# Patient Record
Sex: Female | Born: 1970
Health system: Southern US, Community
[De-identification: ages and names within clinical notes are randomized; demographics above are authoritative.]

## PROBLEM LIST (undated history)

## (undated) DIAGNOSIS — E7800 Pure hypercholesterolemia, unspecified: Secondary | ICD-10-CM

## (undated) DIAGNOSIS — K219 Gastro-esophageal reflux disease without esophagitis: Secondary | ICD-10-CM

## (undated) HISTORY — PX: OOPHORECTOMY: SHX6387

## (undated) HISTORY — PX: CHOLECYSTECTOMY: SHX55

---

## 1998-06-08 ENCOUNTER — Emergency Department (HOSPITAL_COMMUNITY): Admission: EM | Admit: 1998-06-08 | Discharge: 1998-06-08 | Payer: Self-pay | Admitting: Emergency Medicine

## 1998-06-08 ENCOUNTER — Encounter: Payer: Self-pay | Admitting: Emergency Medicine

## 2004-02-26 ENCOUNTER — Other Ambulatory Visit: Admission: RE | Admit: 2004-02-26 | Discharge: 2004-02-26 | Payer: Self-pay | Admitting: Family Medicine

## 2005-01-23 ENCOUNTER — Emergency Department (HOSPITAL_COMMUNITY): Admission: EM | Admit: 2005-01-23 | Discharge: 2005-01-23 | Payer: Self-pay | Admitting: Emergency Medicine

## 2005-02-16 ENCOUNTER — Ambulatory Visit (HOSPITAL_COMMUNITY): Admission: RE | Admit: 2005-02-16 | Discharge: 2005-02-16 | Payer: Self-pay | Admitting: *Deleted

## 2005-02-16 ENCOUNTER — Encounter (INDEPENDENT_AMBULATORY_CARE_PROVIDER_SITE_OTHER): Payer: Self-pay | Admitting: *Deleted

## 2005-02-23 ENCOUNTER — Inpatient Hospital Stay (HOSPITAL_COMMUNITY): Admission: EM | Admit: 2005-02-23 | Discharge: 2005-02-27 | Payer: Self-pay | Admitting: *Deleted

## 2005-03-03 ENCOUNTER — Ambulatory Visit (HOSPITAL_COMMUNITY): Admission: RE | Admit: 2005-03-03 | Discharge: 2005-03-03 | Payer: Self-pay | Admitting: Gastroenterology

## 2005-03-10 ENCOUNTER — Encounter: Admission: RE | Admit: 2005-03-10 | Discharge: 2005-03-10 | Payer: Self-pay | Admitting: Gastroenterology

## 2005-04-14 ENCOUNTER — Other Ambulatory Visit: Admission: RE | Admit: 2005-04-14 | Discharge: 2005-04-14 | Payer: Self-pay | Admitting: Family Medicine

## 2005-04-24 ENCOUNTER — Ambulatory Visit (HOSPITAL_COMMUNITY): Admission: RE | Admit: 2005-04-24 | Discharge: 2005-04-24 | Payer: Self-pay | Admitting: Gastroenterology

## 2009-01-22 ENCOUNTER — Other Ambulatory Visit: Admission: RE | Admit: 2009-01-22 | Discharge: 2009-01-22 | Payer: Self-pay | Admitting: Obstetrics and Gynecology

## 2009-02-10 ENCOUNTER — Encounter: Admission: RE | Admit: 2009-02-10 | Discharge: 2009-02-10 | Payer: Self-pay | Admitting: Obstetrics and Gynecology

## 2010-08-29 NOTE — H&P (Signed)
NAMEMARGEAN, KORELL                ACCOUNT NO.:  [id number]   MEDICAL RECORD NO.:  [id number]          PATIENT TYPE:  EMS   LOCATION:  ED                           FACILITY:  [facility]   PHYSICIAN:  Vikki Ports, MDDATE OF BIRTH:  March 10, 1971   DATE OF ADMISSION:  02/23/2005  DATE OF DISCHARGE:                                HISTORY & PHYSICAL   HISTORY OF PRESENT ILLNESS:  The patient is a 40 year old white female now  one week status post lap chole for cholelithiasis and one episode of  cholecystitis now with a 3 day history of nausea, vague abdominal discomfort  and dark urine. She was seen by her primary care doctor and sent here for  further evaluation and treatment. Workup here shows elevated liver function  tests, lipase and a normal white count.   PAST MEDICAL HISTORY:  None.   PAST SURGICAL HISTORY:  As above.   MEDICATIONS:  None.   PHYSICAL EXAMINATION:  VITAL SIGNS:  She is afebrile, heart rate is in the  80s, blood pressure is 126/70.  HEENT:  Benign, normocephalic, atraumatic. Pupils equal round and reactive  to light. Conjunctivae are without injection. Sclera are icteric.  LUNGS:  Clear to auscultation and percussion x2.  HEART:  Regular rate and rhythm without murmurs, rubs or gallops.  ABDOMEN:  Morbidly obese, soft, and tender in the epigastrium with normal  bowel sounds. There are no abdominal wall hernia defects.  EXTREMITIES:  Show no cyanosis, clubbing or edema.   LABORATORY DATA:  Bilirubin of 4.5, AST of 100, ALT of 206, lipase of 365  and a white count of 4000.   IMPRESSION:  Retained common duct stone with probable pancreatitis secondary  to that.   PLAN:  HIDA scan, CT scan of the abdomen, GI consultation. I have already  discussed this with Dr. Jeani Hawking and IV antibiotics.      Vikki Ports, MD  Electronically Signed     KRH/MEDQ  D:  02/23/2005  T:  02/23/2005  Job:  [id number]

## 2010-08-29 NOTE — Op Note (Signed)
NAMECASSADEE, VANZANDT                ACCOUNT NO.:  [id number]   MEDICAL RECORD NO.:  [id number]          PATIENT TYPE:  AMB   LOCATION:  DAY                          FACILITY:  [facility]   PHYSICIAN:  Vikki Ports, MDDATE OF BIRTH:  [birth year]   DATE OF PROCEDURE:  02/16/2005  DATE OF DISCHARGE:                                 OPERATIVE REPORT   PREOPERATIVE DIAGNOSIS:  Symptomatic cholelithiasis.   POSTOPERATIVE DIAGNOSIS:  Symptomatic cholelithiasis.   PROCEDURE:  Laparoscopic cholecystectomy.   SURGEON:  Dr. Danna Hefty   ASSISTANT:  Dr. Claud Kelp   ANESTHESIA:  General.   DESCRIPTION:  The patient was taken to the operating room, placed in a  supine position.  After adequate general anesthesia was induced using  endotracheal tube, the abdomen was prepped and draped in normal sterile  fashion.  Using transverse infraumbilical incision, I dissected down to the  fascia.  Fascia was opened vertically.  Of note, the patient was quite deep  down to the fascia secondary to morbid obesity.  An 0 Vicryl pursestring  suture was placed around the fascial defect and Hasson trocar was placed in  the abdomen.  Pneumoperitoneum was obtained.  Under direct visualization, an  11 mm trocar was placed in the subxiphoid region, two 5 mm ports were placed  in the right abdomen.  The gallbladder was identified and retracted  cephalad.  It was quite firm and therefore was aspirated.  Dissection at the  neck of the gallbladder in visualizing the triangle of Calot and cystic duct  at its junction with the gallbladder and common duct were identified.  It  was triply clipped and divided.  Cystic artery was dissected in a similar  fashion, triply clipped, and divided.  Gallbladder was taken off the  gallbladder bed using Bovie electrocautery.  Of note, the gallbladder was  entered with some spillage of stones.  It was placed in EndoCatch bag and  removed through the umbilical port.   Right upper quadrant was copiously  irrigated; stones were removed.  Adequate hemostasis was ensured.  Incisions  were injected using Marcaine.  Pneumoperitoneum was released.  Infraumbilical fascial defect was closed with the 0 Vicryl pursestring  suture.  Skin incisions were closed with subcuticular 4-0 Monocryl.  Steri-  Strips and sterile dressings were applied.  The patient the tolerated  procedure well and went to PACU in good condition.      Vikki Ports, MD  Electronically Signed     KRH/MEDQ  D:  02/16/2005  T:  02/16/2005  Job:  [id number]

## 2010-08-29 NOTE — Consult Note (Signed)
Nicole Walters, Nicole Walters                ACCOUNT NO.:  [id number]   MEDICAL RECORD NO.:  [id number]          PATIENT TYPE:  INP   LOCATION:  1606                         FACILITY:  [facility]   PHYSICIAN:  Jordan Hawks. Elnoria Howard, MD    DATE OF BIRTH:  1970-06-16   DATE OF CONSULTATION:  02/23/2005  DATE OF DISCHARGE:                                   CONSULTATION   REASON FOR CONSULTATION:  Obstructive jaundice.   REFERRING PHYSICIAN:  Vikki Ports, MD   HISTORY OF PRESENT ILLNESS:  This is a 40 year old black female with a past  medical history of cholelithiasis, status post laparoscopic cholecystectomy  approximately one week ago by Dr. Luan Pulling.  This was performed without  any complications and prior to the surgical procedure, there are no  abnormalities noted at that time.  She subsequently underwent the procedure  and did well postoperatively until this past Friday.  Patient states that  she had nausea, vomiting, and abdominal pain.  The patient also reported  having chills with it but no reports of any fever.  The symptoms continued  to pertinent throughout the weekend, and she subsequently went to her  primary care physician and felt that she needed to be evaluated in the  emergency room.  Subsequent evaluation in the emergency room revealed that  she had elevation in her transaminases, consistent with an obstructive  etiology and an elevation in the lipase.  The white blood cell count was  noted to be normal.   PAST MEDICAL HISTORY:  As stated above.   PAST SURGICAL HISTORY:  As stated above.   MEDICATIONS:  None.   REVIEW OF SYSTEMS:  Significant for nausea and vomiting.  Headache.  No  blurry vision.  No dysuria, dysarthria, muscle pain, arthritis, chest pain,  shortness of breath, fever.  It is positive for chills.   ALLERGIES:  None.   PHYSICAL EXAMINATION:  VITAL SIGNS:  Blood pressure 142/92, heart rate 84,  temperature 98.5.  GENERAL:  The patient is in no acute  distress; however, she does appear to  be fatigued and jaundiced.  HEENT:  Normocephalic and atraumatic.  Extraocular muscles are intact.  Pupils are equal, round and reactive to light.  NECK:  Neck is supple with no lymphadenopathy.  LUNGS:  Clear to auscultation bilaterally.  CARDIOVASCULAR:  Regular rate and rhythm without murmurs, rubs or gallops.  ABDOMEN:  Obese, soft.  Tender in the epigastric region.  There is also some  mild tenderness at the laparoscopic incision sites, which appear to be  healing well.  Positive bowel sounds.  EXTREMITIES:  No clubbing, cyanosis or edema.   LABORATORY VALUES:  White blood cell count 4.8, hemoglobin 13.3, platelets  251.  Sodium 141, potassium 3.9, chloride 102, CO2 30, BUN 8, creatinine  0.7.  Glucose 99.  AST 100, ALT 206.  Alk phos 370.  Total bilirubin 4.6.  Lipase 367.  PT 12, INR 0.9, PTT 26.   IMPRESSION:  1.  Gallstone pancreatitis.  2.  Status post laparoscopic cholecystectomy.   I have reviewed the patient's chart and  the recent HIDA scan.  The HIDA scan  does not reveal any __________ of the tracer into the small intestine, which  is suggestive of an obstructive etiology.  The CT scan of the abdomen is  pending at this time.  From the evaluation of the HIDA scan as well as the  patient's history and the laboratory findings, consistent with a gallstone  pancreatitis.  There is no evidence of any ascending cholangitis at this  time.  No intraoperative cholangiogram was performed at the time of the  laparoscopic cholecystectomy.   PLAN:  1.  Add ciprofloxacin 500 mg IV q.12h.  2.  Plan for ERCP tomorrow.  3.  Follow up on the CT scan.  4.  N.P.O. after midnight.  5.  If the patient's clinical status should acutely change, an emergent ERCP      will be performed.      Jordan Hawks Elnoria Howard, MD  Electronically Signed     PDH/MEDQ  D:  02/23/2005  T:  02/23/2005  Job:  [id number]   cc:   Vikki Ports, MD  [address], [address]  [place]  [place] [address]

## 2010-08-29 NOTE — Discharge Summary (Signed)
NAMEUBAH, RADKE                ACCOUNT NO.:  [id number]   MEDICAL RECORD NO.:  [id number]          PATIENT TYPE:  INP   LOCATION:  1606                         FACILITY:  [facility]   PHYSICIAN:  Vikki Ports, MDDATE OF BIRTH:  [birth year]   DATE OF ADMISSION:  02/23/2005  DATE OF DISCHARGE:  02/27/2005                                 DISCHARGE SUMMARY   ADMISSION DIAGNOSIS:  Status post laparoscopic cholecystectomy with  choledocholithiasis.   DISCHARGE DIAGNOSIS:  Status post laparoscopic cholecystectomy with  choledocholithiasis.   PROCEDURE:  ERCP x2.   CONDITION ON DISCHARGE:  Good and improved.   DISPOSITIONS:  Follow up with Dr. Elnoria Howard in the office.   MEDICATIONS AT DISCHARGE:  Cipro, Vistaril and cholestyramine.   BRIEF HISTORY:  The patient is a 40 year old female 1 week status post lap  chole who presented to her primary care doctor with evidence of jaundice.  She was admitted with elevation of her liver function tests and lipase. She  was seen in consultation by Dr. Jeani Hawking and admitted for IV fluids,  antibiotics and probable ERCP. The patient underwent ERCP and extraction of  a common duct stone and sphincterotomy by Dr. Elnoria Howard.  While in the hospital,  however, her liver function tests did not decrease as expected and she was  watched by Dr. Elnoria Howard. Her pain, however, did improve. While in the hospital,  her LFTs actually somewhat increased and she was taken back for repeat ERCP  on hospital day #3. Dr. Elnoria Howard saw no evidence of ascending cholangitis and  opted to discharge her on p.o. ciprofloxacin and cholestyramine. Follow up  with the patient in the office 3 days after discharge. Diagnosis at that  time was papillary edema.      Vikki Ports, MD  Electronically Signed     KRH/MEDQ  D:  03/16/2005  T:  03/17/2005  Job:  [id number]   cc:   Jordan Hawks. Elnoria Howard, MD  Fax: [phone]

## 2011-01-11 ENCOUNTER — Emergency Department (HOSPITAL_COMMUNITY)
Admission: EM | Admit: 2011-01-11 | Discharge: 2011-01-11 | Disposition: A | Discharge status: Home / Self Care | Payer: Commercial Managed Care - PPO | Attending: Emergency Medicine | Admitting: Emergency Medicine

## 2011-01-11 ENCOUNTER — Emergency Department (HOSPITAL_COMMUNITY): Payer: Commercial Managed Care - PPO

## 2011-01-11 DIAGNOSIS — S335XXA Sprain of ligaments of lumbar spine, initial encounter: Secondary | ICD-10-CM | POA: Insufficient documentation

## 2011-01-11 DIAGNOSIS — Z79899 Other long term (current) drug therapy: Secondary | ICD-10-CM | POA: Insufficient documentation

## 2011-01-11 DIAGNOSIS — R109 Unspecified abdominal pain: Secondary | ICD-10-CM | POA: Insufficient documentation

## 2011-01-11 DIAGNOSIS — M545 Low back pain, unspecified: Secondary | ICD-10-CM | POA: Insufficient documentation

## 2011-01-11 DIAGNOSIS — F3289 Other specified depressive episodes: Secondary | ICD-10-CM | POA: Insufficient documentation

## 2011-01-11 DIAGNOSIS — F329 Major depressive disorder, single episode, unspecified: Secondary | ICD-10-CM | POA: Insufficient documentation

## 2011-01-11 DIAGNOSIS — X58XXXA Exposure to other specified factors, initial encounter: Secondary | ICD-10-CM | POA: Insufficient documentation

## 2011-01-11 LAB — URINALYSIS, ROUTINE W REFLEX MICROSCOPIC
Bilirubin Urine: NEGATIVE
Glucose, UA: NEGATIVE mg/dL
Hgb urine dipstick: NEGATIVE
Ketones, ur: NEGATIVE mg/dL
Leukocytes, UA: NEGATIVE
Nitrite: NEGATIVE
Protein, ur: NEGATIVE mg/dL
Specific Gravity, Urine: 1.014 (ref 1.005–1.030)
Urobilinogen, UA: 0.2 mg/dL (ref 0.0–1.0)
pH: 6 (ref 5.0–8.0)

## 2011-01-11 LAB — CBC
HCT: 37.2 % (ref 36.0–46.0)
Hemoglobin: 12.5 g/dL (ref 12.0–15.0)
MCH: 30.7 pg (ref 26.0–34.0)
MCHC: 33.6 g/dL (ref 30.0–36.0)
MCV: 91.4 fL (ref 78.0–100.0)
Platelets: 276 K/uL (ref 150–400)
RBC: 4.07 MIL/uL (ref 3.87–5.11)
RDW: 13.7 % (ref 11.5–15.5)
WBC: 6.7 K/uL (ref 4.0–10.5)

## 2011-01-11 LAB — POCT I-STAT, CHEM 8
Calcium, Ion: 1.14 mmol/L (ref 1.12–1.32)
Chloride: 103 mEq/L (ref 96–112)
Creatinine, Ser: 0.9 mg/dL (ref 0.50–1.10)
Glucose, Bld: 99 mg/dL (ref 70–99)
HCT: 39 % (ref 36.0–46.0)
Hemoglobin: 13.3 g/dL (ref 12.0–15.0)
Potassium: 3.8 mEq/L (ref 3.5–5.1)
TCO2: 26 mmol/L (ref 0–100)

## 2011-01-11 LAB — DIFFERENTIAL
Basophils Absolute: 0.1 10*3/uL (ref 0.0–0.1)
Eosinophils Absolute: 0.1 10*3/uL (ref 0.0–0.7)
Eosinophils Relative: 2 % (ref 0–5)
Lymphocytes Relative: 37 % (ref 12–46)
Lymphs Abs: 2.5 10*3/uL (ref 0.7–4.0)
Monocytes Absolute: 0.5 10*3/uL (ref 0.1–1.0)
Monocytes Relative: 7 % (ref 3–12)
Neutro Abs: 3.6 10*3/uL (ref 1.7–7.7)

## 2011-01-11 LAB — POCT PREGNANCY, URINE: Preg Test, Ur: NEGATIVE

## 2011-10-27 ENCOUNTER — Other Ambulatory Visit (HOSPITAL_COMMUNITY)
Admission: RE | Admit: 2011-10-27 | Discharge: 2011-10-27 | Disposition: A | Discharge status: Home / Self Care | Payer: Commercial Managed Care - PPO | Source: Ambulatory Visit | Attending: Family Medicine | Admitting: Family Medicine

## 2011-10-27 ENCOUNTER — Other Ambulatory Visit: Payer: Self-pay | Admitting: Family Medicine

## 2011-10-27 DIAGNOSIS — Z0000 Encounter for general adult medical examination without abnormal findings: Secondary | ICD-10-CM | POA: Insufficient documentation

## 2011-12-18 ENCOUNTER — Other Ambulatory Visit: Payer: Self-pay | Admitting: Obstetrics and Gynecology

## 2011-12-21 ENCOUNTER — Other Ambulatory Visit: Payer: Self-pay | Admitting: Obstetrics and Gynecology

## 2011-12-21 DIAGNOSIS — N938 Other specified abnormal uterine and vaginal bleeding: Secondary | ICD-10-CM

## 2011-12-25 ENCOUNTER — Ambulatory Visit (HOSPITAL_COMMUNITY)
Admission: RE | Admit: 2011-12-25 | Discharge: 2011-12-25 | Disposition: A | Discharge status: Home / Self Care | Payer: Commercial Managed Care - PPO | Source: Ambulatory Visit | Attending: Obstetrics and Gynecology | Admitting: Obstetrics and Gynecology

## 2011-12-25 DIAGNOSIS — N949 Unspecified condition associated with female genital organs and menstrual cycle: Secondary | ICD-10-CM | POA: Insufficient documentation

## 2011-12-25 DIAGNOSIS — N938 Other specified abnormal uterine and vaginal bleeding: Secondary | ICD-10-CM

## 2013-03-17 ENCOUNTER — Emergency Department (HOSPITAL_COMMUNITY): Payer: Commercial Managed Care - PPO

## 2013-03-17 ENCOUNTER — Emergency Department (HOSPITAL_COMMUNITY)
Admission: EM | Admit: 2013-03-17 | Discharge: 2013-03-17 | Disposition: A | Discharge status: Home / Self Care | Payer: Commercial Managed Care - PPO | Attending: Emergency Medicine | Admitting: Emergency Medicine

## 2013-03-17 ENCOUNTER — Encounter (HOSPITAL_COMMUNITY): Payer: Self-pay | Admitting: Emergency Medicine

## 2013-03-17 DIAGNOSIS — S46909A Unspecified injury of unspecified muscle, fascia and tendon at shoulder and upper arm level, unspecified arm, initial encounter: Secondary | ICD-10-CM | POA: Insufficient documentation

## 2013-03-17 DIAGNOSIS — S298XXA Other specified injuries of thorax, initial encounter: Secondary | ICD-10-CM | POA: Insufficient documentation

## 2013-03-17 DIAGNOSIS — S4980XA Other specified injuries of shoulder and upper arm, unspecified arm, initial encounter: Secondary | ICD-10-CM | POA: Insufficient documentation

## 2013-03-17 DIAGNOSIS — W1809XA Striking against other object with subsequent fall, initial encounter: Secondary | ICD-10-CM

## 2013-03-17 DIAGNOSIS — R0789 Other chest pain: Secondary | ICD-10-CM

## 2013-03-17 DIAGNOSIS — M25511 Pain in right shoulder: Secondary | ICD-10-CM

## 2013-03-17 DIAGNOSIS — Y939 Activity, unspecified: Secondary | ICD-10-CM | POA: Insufficient documentation

## 2013-03-17 DIAGNOSIS — IMO0001 Reserved for inherently not codable concepts without codable children: Secondary | ICD-10-CM | POA: Insufficient documentation

## 2013-03-17 DIAGNOSIS — W010XXA Fall on same level from slipping, tripping and stumbling without subsequent striking against object, initial encounter: Secondary | ICD-10-CM | POA: Insufficient documentation

## 2013-03-17 DIAGNOSIS — Y929 Unspecified place or not applicable: Secondary | ICD-10-CM | POA: Insufficient documentation

## 2013-03-17 DIAGNOSIS — Z79899 Other long term (current) drug therapy: Secondary | ICD-10-CM | POA: Insufficient documentation

## 2013-03-17 MED ORDER — IBUPROFEN 800 MG PO TABS: 800.0000 mg | ORAL_TABLET | Freq: Three times a day (TID) | ORAL | Status: DC

## 2013-03-17 MED ORDER — HYDROCODONE-ACETAMINOPHEN 5-325 MG PO TABS: 1.0000 | ORAL_TABLET | Freq: Four times a day (QID) | ORAL | Status: DC | PRN

## 2013-03-17 MED ORDER — HYDROCODONE-ACETAMINOPHEN 5-325 MG PO TABS
1.0000 | ORAL_TABLET | Freq: Once | ORAL | Status: AC
  Administered 2013-03-17: 1 via ORAL
  Filled 2013-03-17: qty 1

## 2013-03-17 NOTE — ED Notes (Signed)
Pt states she struck her right shoulder and right lateral rib area on the vanity in the bathroom.

## 2013-03-17 NOTE — ED Provider Notes (Signed)
CSN: [id number]     Arrival date & time 03/17/13  1446 History  This chart was scribed for non-physician practitioner, Jeannetta Ellis, PA-C, working with Layla Maw Ward, DO by Shari Heritage, ED Scribe. This patient was seen in room TR06C/TR06C and the patient's care was started at 3:45 PM.    Chief Complaint  Patient presents with  . Fall    The history is provided by the patient. No language interpreter was used.    HPI Comments: Nicole Walters is a 42 y.o. female who presents to the Emergency Department complaining of a fall that occurred about 2 hours ago. Patient states that she was getting out of the shower when she slipped and struck her right shoulder and right lateral rib against the toilet and vanity, but did not hit her head or LOC. She is complaining of constant right shoulder and upper arm pain as well as right rib pain. She rates pain as 10/10 w/o any alleviating or aggravating factors. She states that she was not dizzy or lightheaded prior to the fall. She also denies any CP, SOB, HA prior to fall. She denies visual disturbance, vomiting, chest pain, headache or loss of consciousness. She states that she had someone drive her to the ED. She has a surgical history of cholecystectomy.   History reviewed. No pertinent past medical history. Past Surgical History  Procedure Laterality Date  . Cholecystectomy     History reviewed. No pertinent family history. History  Substance Use Topics  . Smoking status: Never Smoker   . Smokeless tobacco: Not on file  . Alcohol Use: No   OB History   Grav Para Term Preterm Abortions TAB SAB Ect Mult Living                 Review of Systems  Constitutional: Negative for fever and chills.  HENT: Negative.   Eyes: Negative for visual disturbance.  Respiratory: Negative for shortness of breath.   Cardiovascular: Negative for chest pain.  Gastrointestinal: Negative for nausea, vomiting and abdominal pain.  Musculoskeletal: Positive  for arthralgias and myalgias.  Skin: Negative.   Neurological: Negative for syncope and headaches.    Allergies  Levaquin  Home Medications   Current Outpatient Rx  Name  Route  Sig  Dispense  Refill  . acetaminophen (TYLENOL) 500 MG tablet   Oral   Take 1,000 mg by mouth 2 (two) times daily as needed (pain).         Marland Kitchen buPROPion (WELLBUTRIN XL) 300 MG 24 hr tablet   Oral   Take 300 mg by mouth daily.          . Calcium Carb-Cholecalciferol (CALCIUM 600 + D PO)   Oral   Take 1 tablet by mouth daily.         . cyclobenzaprine (FLEXERIL) 10 MG tablet   Oral   Take 10 mg by mouth daily as needed for muscle spasms.          Marland Kitchen esomeprazole (NEXIUM) 40 MG capsule   Oral   Take 40 mg by mouth daily at 12 noon.         Marland Kitchen FLUoxetine (PROZAC) 40 MG capsule   Oral   Take 40 mg by mouth daily.          . Glucosamine-Chondroit-Vit C-Mn (GLUCOSAMINE-CHONDROITIN) CAPS   Oral   Take 2 capsules by mouth daily. Glucosamine 1500 mg/ chondroiton 1200 mg         . levonorgestrel (  MIRENA) 20 MCG/24HR IUD   Intrauterine   1 each by Intrauterine route once. Implanted November or December 2013         . loratadine (CLARITIN) 10 MG tablet   Oral   Take 10 mg by mouth daily.         Marland Kitchen lovastatin (MEVACOR) 20 MG tablet   Oral   Take 20 mg by mouth daily.          . Multiple Vitamin (MULTIVITAMIN WITH MINERALS) TABS tablet   Oral   Take 1 tablet by mouth daily.         . rizatriptan (MAXALT) 10 MG tablet   Oral   Take 10 mg by mouth as needed for migraine. May take 2nd tablet if needed - no more than 2 tablets in 24 hours         . zolpidem (AMBIEN) 10 MG tablet   Oral   Take 10 mg by mouth at bedtime as needed for sleep.          Marland Kitchen HYDROcodone-acetaminophen (NORCO/VICODIN) 5-325 MG per tablet   Oral   Take 1 tablet by mouth every 6 (six) hours as needed for severe pain.   8 tablet   0   . ibuprofen (ADVIL,MOTRIN) 800 MG tablet   Oral   Take 1  tablet (800 mg total) by mouth 3 (three) times daily.   21 tablet   0    Triage Vitals: BP 157/95  Pulse 76  Temp(Src) 98.2 F (36.8 C) (Oral)  Resp 22  SpO2 96% Physical Exam  Constitutional: She is oriented to person, place, and time. She appears well-developed and well-nourished. No distress.  HENT:  Head: Normocephalic and atraumatic.  Right Ear: External ear normal.  Left Ear: External ear normal.  Nose: Nose normal.  Mouth/Throat: Oropharynx is clear and moist. No oropharyngeal exudate.  Eyes: Conjunctivae and EOM are normal. Pupils are equal, round, and reactive to light.  Neck: Normal range of motion. Neck supple.  Cardiovascular: Normal rate, regular rhythm, normal heart sounds and intact distal pulses.   Pulmonary/Chest: Effort normal and breath sounds normal. No respiratory distress.  Abdominal: Soft. There is no tenderness.  Musculoskeletal: Normal range of motion.       Right shoulder: She exhibits tenderness. She exhibits normal range of motion, no bony tenderness and no effusion.  Negative Adson's maneuver. Negative empty can test. ROM intact with Appley Scratch maneuver.   Neurological: She is alert and oriented to person, place, and time. She has normal strength. No cranial nerve deficit or sensory deficit. Gait normal. GCS eye subscore is 4. GCS verbal subscore is 5. GCS motor subscore is 6.  No pronator drift. Bilateral heel-knee-shin intact.  Skin: Skin is warm and dry. Abrasion noted. She is not diaphoretic.       ED Course  Procedures (including critical care time) DIAGNOSTIC STUDIES: Oxygen Saturation is 96% on room air, adequate by my interpretation.    COORDINATION OF CARE: 3:49 PM- Patient presents with shoulder pain and rib pain after a fall. X-rays are negative for fracture or other bony injury. Will prescribe a course of pain medicines. Have advised patient to apply ice, rest and take anti-inflammatory medications. Will provide information for  orthopedics if problem worsens. Patient informed of current plan for treatment and evaluation and agrees with plan at this time.    Imaging Review Dg Chest 2 View  03/17/2013   CLINICAL DATA:  Fall, pain  EXAM: CHEST  2 VIEW  COMPARISON:  01/11/2011  FINDINGS: Cardiomediastinal silhouette is stable. Mild elevation of the right hemidiaphragm again noted. No acute infiltrate or pulmonary edema. No diagnostic pneumothorax.  IMPRESSION: No active cardiopulmonary disease.   Electronically Signed   By: Natasha Mead M.D.   On: 03/17/2013 15:35   Dg Shoulder Right  03/17/2013   CLINICAL DATA:  Fall, right shoulder pain  EXAM: RIGHT SHOULDER - 2+ VIEW  COMPARISON:  None.  FINDINGS: There is no evidence of fracture or dislocation. There is no evidence of arthropathy or other focal bone abnormality. Soft tissues are unremarkable.  IMPRESSION: Negative.   Electronically Signed   By: Natasha Mead M.D.   On: 03/17/2013 15:33    EKG Interpretation   None       MDM   1. Right shoulder pain   2. Fall against object, initial encounter   3. Right-sided chest wall pain     Afebrile, NAD, non-toxic appearing, AAOx4. No neurofocal deficits. Neurovascularly intact. Normal sensation. Fall mechanical in nature. I have reviewed nursing notes, vital signs, and all appropriate lab and imaging results for this patient.   1) Shoulder pain: PE shows no instability, tenderness, or deformity of acromioclavicular and sternoclavicular joints, the cervical spine, glenohumeral joint, coracoid process, acromion, or scapula. Good shoulder strength during empty can test. Good ROM during scratch test. No signs of impingement on Adson's maneuver.   2) Right sided chest wall pain: abrasions noted. No bruising. TTP. No obvious deformity. Lungs CTA. CXR reviewed.    Return precautions discussed. Patient is agreeable to plan. Patient is stable at time of discharge    I personally performed the services described in this  documentation, which was scribed in my presence. The recorded information has been reviewed and is accurate.     Jeannetta Ellis, PA-C 03/17/13 2204

## 2013-03-17 NOTE — ED Notes (Addendum)
Per pt sts she was in the shower and fell on her right shoulder. sts hurts when she breathes. sts her upper back area and side hurt also. sts she slipped and did not pass out.

## 2013-03-17 NOTE — ED Provider Notes (Signed)
Medical screening examination/treatment/procedure(s) were performed by non-physician practitioner and as supervising physician I was immediately available for consultation/collaboration.  EKG Interpretation   None         Belinda Bringhurst N Melicia Esqueda, DO 03/17/13 2357 

## 2013-10-03 ENCOUNTER — Ambulatory Visit
Admission: RE | Admit: 2013-10-03 | Discharge: 2013-10-03 | Disposition: A | Discharge status: Home / Self Care | Payer: Commercial Managed Care - PPO | Source: Ambulatory Visit | Attending: Family Medicine | Admitting: Family Medicine

## 2013-10-03 ENCOUNTER — Other Ambulatory Visit: Payer: Self-pay | Admitting: Family Medicine

## 2013-10-03 DIAGNOSIS — M79672 Pain in left foot: Secondary | ICD-10-CM

## 2014-08-01 ENCOUNTER — Emergency Department (HOSPITAL_COMMUNITY): Payer: Worker's Compensation

## 2014-08-01 ENCOUNTER — Emergency Department (HOSPITAL_COMMUNITY)
Admission: EM | Admit: 2014-08-01 | Discharge: 2014-08-01 | Disposition: A | Discharge status: Home / Self Care | Payer: Worker's Compensation | Attending: Emergency Medicine | Admitting: Emergency Medicine

## 2014-08-01 ENCOUNTER — Encounter (HOSPITAL_COMMUNITY): Payer: Self-pay | Admitting: *Deleted

## 2014-08-01 DIAGNOSIS — Y990 Civilian activity done for income or pay: Secondary | ICD-10-CM | POA: Diagnosis not present

## 2014-08-01 DIAGNOSIS — Y9289 Other specified places as the place of occurrence of the external cause: Secondary | ICD-10-CM | POA: Insufficient documentation

## 2014-08-01 DIAGNOSIS — E780 Pure hypercholesterolemia: Secondary | ICD-10-CM | POA: Diagnosis not present

## 2014-08-01 DIAGNOSIS — Z791 Long term (current) use of non-steroidal anti-inflammatories (NSAID): Secondary | ICD-10-CM | POA: Diagnosis not present

## 2014-08-01 DIAGNOSIS — Y9389 Activity, other specified: Secondary | ICD-10-CM | POA: Insufficient documentation

## 2014-08-01 DIAGNOSIS — K219 Gastro-esophageal reflux disease without esophagitis: Secondary | ICD-10-CM | POA: Insufficient documentation

## 2014-08-01 DIAGNOSIS — S6992XA Unspecified injury of left wrist, hand and finger(s), initial encounter: Secondary | ICD-10-CM | POA: Diagnosis present

## 2014-08-01 DIAGNOSIS — W231XXA Caught, crushed, jammed, or pinched between stationary objects, initial encounter: Secondary | ICD-10-CM | POA: Diagnosis not present

## 2014-08-01 DIAGNOSIS — Z79899 Other long term (current) drug therapy: Secondary | ICD-10-CM | POA: Insufficient documentation

## 2014-08-01 DIAGNOSIS — S63502A Unspecified sprain of left wrist, initial encounter: Secondary | ICD-10-CM | POA: Insufficient documentation

## 2014-08-01 HISTORY — DX: Gastro-esophageal reflux disease without esophagitis: K21.9

## 2014-08-01 HISTORY — DX: Pure hypercholesterolemia, unspecified: E78.00

## 2014-08-01 MED ORDER — IBUPROFEN 800 MG PO TABS: 800.0000 mg | ORAL_TABLET | Freq: Three times a day (TID) | ORAL | Status: DC

## 2014-08-01 MED ORDER — IBUPROFEN 400 MG PO TABS
800.0000 mg | ORAL_TABLET | Freq: Once | ORAL | Status: AC
  Administered 2014-08-01: 800 mg via ORAL
  Filled 2014-08-01: qty 2

## 2014-08-01 NOTE — Discharge Instructions (Signed)
Take ibuprofen as needed for pain. Refer to attached documents for more information. Rest, ice, and elevate your wrist.

## 2014-08-01 NOTE — ED Notes (Signed)
Pt reports she opened the safe at her work place rthis AM and the door fell off and injured her arm. On observation Lt wrist is bruised.

## 2014-08-01 NOTE — ED Provider Notes (Signed)
CSN: [id number]     Arrival date & time 08/01/14  7829 History   First MD Initiated Contact with Patient 08/01/14 [phone]     Chief Complaint  Patient presents with  . Wrist Pain     (Consider location/radiation/quality/duration/timing/severity/associated sxs/prior Treatment) HPI Comments: Patient is a 44 year old female who presents with left wrist pain that started this morning when the door to a safe fell on her left arm at work. The pain started suddenly and remained constant since the onset. The pain is throbbing and severe without radiation. No other injury. Movement and palpation makes the pain worse. No alleviating factors. No other associated symptoms.    Past Medical History  Diagnosis Date  . GERD (gastroesophageal reflux disease)   . Hypercholesterolemia    Past Surgical History  Procedure Laterality Date  . Cholecystectomy    . Oophorectomy      unsure location   History reviewed. No pertinent family history. History  Substance Use Topics  . Smoking status: Never Smoker   . Smokeless tobacco: Never Used  . Alcohol Use: No   OB History    No data available     Review of Systems  Musculoskeletal: Positive for arthralgias.  All other systems reviewed and are negative.     Allergies  Levaquin  Home Medications   Prior to Admission medications   Medication Sig Start Date End Date Taking? Authorizing Provider  acetaminophen (TYLENOL) 500 MG tablet Take 1,000 mg by mouth 2 (two) times daily as needed (pain).    Historical Provider, MD  buPROPion (WELLBUTRIN XL) 300 MG 24 hr tablet Take 300 mg by mouth daily.  02/02/13   Historical Provider, MD  Calcium Carb-Cholecalciferol (CALCIUM 600 + D PO) Take 1 tablet by mouth daily.    Historical Provider, MD  cyclobenzaprine (FLEXERIL) 10 MG tablet Take 10 mg by mouth daily as needed for muscle spasms.  01/10/13   Historical Provider, MD  esomeprazole (NEXIUM) 40 MG capsule Take 40 mg by mouth daily at 12 noon.     Historical Provider, MD  FLUoxetine (PROZAC) 40 MG capsule Take 40 mg by mouth daily.  03/04/13   Historical Provider, MD  Glucosamine-Chondroit-Vit C-Mn (GLUCOSAMINE-CHONDROITIN) CAPS Take 2 capsules by mouth daily. Glucosamine 1500 mg/ chondroiton 1200 mg    Historical Provider, MD  HYDROcodone-acetaminophen (NORCO/VICODIN) 5-325 MG per tablet Take 1 tablet by mouth every 6 (six) hours as needed for severe pain. 03/17/13   Jennifer Piepenbrink, PA-C  ibuprofen (ADVIL,MOTRIN) 800 MG tablet Take 1 tablet (800 mg total) by mouth 3 (three) times daily. 03/17/13   Francee Piccolo, PA-C  levonorgestrel (MIRENA) 20 MCG/24HR IUD 1 each by Intrauterine route once. Implanted November or December 2013    Historical Provider, MD  loratadine (CLARITIN) 10 MG tablet Take 10 mg by mouth daily.    Historical Provider, MD  lovastatin (MEVACOR) 20 MG tablet Take 20 mg by mouth daily.  01/25/13   Historical Provider, MD  Multiple Vitamin (MULTIVITAMIN WITH MINERALS) TABS tablet Take 1 tablet by mouth daily.    Historical Provider, MD  rizatriptan (MAXALT) 10 MG tablet Take 10 mg by mouth as needed for migraine. May take 2nd tablet if needed - no more than 2 tablets in 24 hours 02/10/13   Historical Provider, MD  zolpidem (AMBIEN) 10 MG tablet Take 10 mg by mouth at bedtime as needed for sleep.  01/26/13   Historical Provider, MD   BP 147/87 mmHg  Pulse 84  Temp(Src)  97.8 F (36.6 C) (Oral)  Resp 16  Ht 5\' 7"  (1.702 m)  Wt 341 lb 9 oz (154.932 kg)  BMI 53.48 kg/m2  SpO2 97% Physical Exam  Constitutional: She is oriented to person, place, and time. She appears well-developed and well-nourished. No distress.  HENT:  Head: Normocephalic and atraumatic.  Eyes: Conjunctivae and EOM are normal.  Neck: Normal range of motion.  Cardiovascular: Normal rate, regular rhythm and intact distal pulses.  Exam reveals no gallop and no friction rub.   No murmur heard. Strong radial pulse  Pulmonary/Chest: Effort  normal and breath sounds normal. She has no wheezes. She has no rales. She exhibits no tenderness.  Abdominal: Soft. There is no tenderness.  Musculoskeletal:  Limited ROM of left wrist due to pain. Generalized bruising and tenderness to palpation. Patient is able to supinate and pronate and wiggle fingers.   Neurological: She is alert and oriented to person, place, and time. Coordination normal.  Speech is goal-oriented. Moves limbs without ataxia.   Skin: Skin is warm and dry.  Psychiatric: She has a normal mood and affect. Her behavior is normal.  Nursing note and vitals reviewed.   ED Course  Procedures (including critical care time) Labs Review Labs Reviewed - No data to display  Imaging Review Dg Wrist Complete Left  08/01/2014   CLINICAL DATA:  Wrist pain secondary to blunt trauma and twisting injury this morning.  EXAM: LEFT WRIST - COMPLETE 3+ VIEW  COMPARISON:  None.  FINDINGS: There is no evidence of fracture or dislocation. There is no evidence of arthropathy or other focal bone abnormality. Soft tissues are unremarkable.  IMPRESSION: Normal exam.   Electronically Signed   By: Francene BoyersJames  Maxwell M.D.   On: 08/01/2014 08:07   SPLINT APPLICATION Date/Time: 8:30 AM Authorized by: Emilia BeckKaitlyn Claudie Rathbone Consent: Verbal consent obtained. Risks and benefits: risks, benefits and alternatives were discussed Consent given by: patient Splint applied by: nurse Location details: left wrist  Splint type: volar velcro Supplies used: volar velcro splint Post-procedure: The splinted body part was neurovascularly unchanged following the procedure. Patient tolerance: Patient tolerated the procedure well with no immediate complications.      EKG Interpretation None      MDM   Final diagnoses:  Left wrist sprain, initial encounter    8:15 AM Patient's xray unremarkable for acute changes. No neurovascular compromise. Patient will have splint for wrist sprain.     [address]Thaddeus Evitts PerkasieSzekalski,  PA-C 08/01/14 [phone]  Mancel BaleElliott Wentz, MD 08/01/14 [phone]

## 2015-03-11 ENCOUNTER — Other Ambulatory Visit: Payer: Self-pay | Admitting: Family Medicine

## 2015-03-11 ENCOUNTER — Other Ambulatory Visit (HOSPITAL_COMMUNITY)
Admission: RE | Admit: 2015-03-11 | Discharge: 2015-03-11 | Disposition: A | Discharge status: Home / Self Care | Payer: Commercial Managed Care - PPO | Source: Ambulatory Visit | Attending: Family Medicine | Admitting: Family Medicine

## 2015-03-11 DIAGNOSIS — Z01419 Encounter for gynecological examination (general) (routine) without abnormal findings: Secondary | ICD-10-CM | POA: Insufficient documentation

## 2015-03-12 LAB — CYTOLOGY - PAP

## 2015-10-23 ENCOUNTER — Encounter (HOSPITAL_COMMUNITY): Payer: Self-pay | Admitting: *Deleted

## 2015-10-23 ENCOUNTER — Inpatient Hospital Stay (HOSPITAL_COMMUNITY)
Admission: EM | Admit: 2015-10-23 | Discharge: 2015-10-29 | DRG: 917 | Disposition: A | Discharge status: Other Acute Inpatient Hospital | Payer: Commercial Managed Care - PPO | Attending: Internal Medicine | Admitting: Internal Medicine

## 2015-10-23 DIAGNOSIS — T40605A Adverse effect of unspecified narcotics, initial encounter: Secondary | ICD-10-CM

## 2015-10-23 DIAGNOSIS — E7800 Pure hypercholesterolemia, unspecified: Secondary | ICD-10-CM | POA: Diagnosis present

## 2015-10-23 DIAGNOSIS — T391X2A Poisoning by 4-Aminophenol derivatives, intentional self-harm, initial encounter: Principal | ICD-10-CM | POA: Diagnosis present

## 2015-10-23 DIAGNOSIS — Z6841 Body Mass Index (BMI) 40.0 and over, adult: Secondary | ICD-10-CM

## 2015-10-23 DIAGNOSIS — T50901A Poisoning by unspecified drugs, medicaments and biological substances, accidental (unintentional), initial encounter: Secondary | ICD-10-CM | POA: Diagnosis not present

## 2015-10-23 DIAGNOSIS — F332 Major depressive disorder, recurrent severe without psychotic features: Secondary | ICD-10-CM | POA: Diagnosis not present

## 2015-10-23 DIAGNOSIS — T50902A Poisoning by unspecified drugs, medicaments and biological substances, intentional self-harm, initial encounter: Secondary | ICD-10-CM | POA: Diagnosis not present

## 2015-10-23 DIAGNOSIS — E860 Dehydration: Secondary | ICD-10-CM | POA: Diagnosis present

## 2015-10-23 DIAGNOSIS — E662 Morbid (severe) obesity with alveolar hypoventilation: Secondary | ICD-10-CM | POA: Diagnosis present

## 2015-10-23 DIAGNOSIS — G4700 Insomnia, unspecified: Secondary | ICD-10-CM | POA: Diagnosis present

## 2015-10-23 DIAGNOSIS — G92 Toxic encephalopathy: Secondary | ICD-10-CM | POA: Diagnosis present

## 2015-10-23 DIAGNOSIS — F4312 Post-traumatic stress disorder, chronic: Secondary | ICD-10-CM | POA: Diagnosis not present

## 2015-10-23 DIAGNOSIS — K219 Gastro-esophageal reflux disease without esophagitis: Secondary | ICD-10-CM | POA: Diagnosis present

## 2015-10-23 DIAGNOSIS — T50902D Poisoning by unspecified drugs, medicaments and biological substances, intentional self-harm, subsequent encounter: Secondary | ICD-10-CM | POA: Diagnosis not present

## 2015-10-23 DIAGNOSIS — G43909 Migraine, unspecified, not intractable, without status migrainosus: Secondary | ICD-10-CM | POA: Diagnosis present

## 2015-10-23 DIAGNOSIS — E872 Acidosis: Secondary | ICD-10-CM | POA: Diagnosis present

## 2015-10-23 DIAGNOSIS — F5104 Psychophysiologic insomnia: Secondary | ICD-10-CM | POA: Diagnosis not present

## 2015-10-23 DIAGNOSIS — N179 Acute kidney failure, unspecified: Secondary | ICD-10-CM | POA: Diagnosis present

## 2015-10-23 DIAGNOSIS — I129 Hypertensive chronic kidney disease with stage 1 through stage 4 chronic kidney disease, or unspecified chronic kidney disease: Secondary | ICD-10-CM | POA: Diagnosis present

## 2015-10-23 DIAGNOSIS — R0689 Other abnormalities of breathing: Secondary | ICD-10-CM | POA: Diagnosis present

## 2015-10-23 DIAGNOSIS — F329 Major depressive disorder, single episode, unspecified: Secondary | ICD-10-CM | POA: Diagnosis present

## 2015-10-23 DIAGNOSIS — Z915 Personal history of self-harm: Secondary | ICD-10-CM | POA: Diagnosis not present

## 2015-10-23 DIAGNOSIS — N182 Chronic kidney disease, stage 2 (mild): Secondary | ICD-10-CM | POA: Diagnosis present

## 2015-10-23 DIAGNOSIS — R45851 Suicidal ideations: Secondary | ICD-10-CM

## 2015-10-23 LAB — BLOOD GAS, ARTERIAL
ACID-BASE DEFICIT: 0.4 mmol/L (ref 0.0–2.0)
Acid-base deficit: 0.7 mmol/L (ref 0.0–2.0)
BICARBONATE: 26 meq/L — AB (ref 20.0–24.0)
BICARBONATE: 25.9 meq/L — AB (ref 20.0–24.0)
DRAWN BY: 103701
DRAWN BY: 103701
O2 CONTENT: 6 L/min
O2 Content: 6 L/min
O2 SAT: 94.7 %
O2 SAT: 98.1 %
PATIENT TEMPERATURE: 98.6
PH ART: 7.303 — AB (ref 7.350–7.450)
PO2 ART: 132 mmHg — AB (ref 80.0–100.0)
Patient temperature: 98.6
TCO2: 23.7 mmol/L (ref 0–100)
TCO2: 23.9 mmol/L (ref 0–100)
pCO2 arterial: 54.1 mmHg — ABNORMAL HIGH (ref 35.0–45.0)
pCO2 arterial: 52.6 mmHg — ABNORMAL HIGH (ref 35.0–45.0)
pH, Arterial: 7.313 — ABNORMAL LOW (ref 7.350–7.450)
pO2, Arterial: 85.8 mmHg (ref 80.0–100.0)

## 2015-10-23 LAB — URINALYSIS, ROUTINE W REFLEX MICROSCOPIC
BILIRUBIN URINE: NEGATIVE
Glucose, UA: NEGATIVE mg/dL
HGB URINE DIPSTICK: NEGATIVE
Ketones, ur: >80 mg/dL — AB
Leukocytes, UA: NEGATIVE
Nitrite: NEGATIVE
Protein, ur: NEGATIVE mg/dL
SPECIFIC GRAVITY, URINE: 1.034 — AB (ref 1.005–1.030)
pH: 5 (ref 5.0–8.0)

## 2015-10-23 LAB — COMPREHENSIVE METABOLIC PANEL
ALBUMIN: 4 g/dL (ref 3.5–5.0)
ALT: 18 U/L (ref 14–54)
AST: 24 U/L (ref 15–41)
Alkaline Phosphatase: 101 U/L (ref 38–126)
Anion gap: 10 (ref 5–15)
BUN: 11 mg/dL (ref 6–20)
CO2: 25 mmol/L (ref 22–32)
Calcium: 9 mg/dL (ref 8.9–10.3)
Chloride: 102 mmol/L (ref 101–111)
Creatinine, Ser: 1.1 mg/dL — ABNORMAL HIGH (ref 0.44–1.00)
GFR calc Af Amer: >60 mL/min (ref >60)
GFR calc non Af Amer: 59 mL/min — ABNORMAL LOW (ref >60)
GLUCOSE: 114 mg/dL — AB (ref 65–99)
POTASSIUM: 3.8 mmol/L (ref 3.5–5.1)
Sodium: 137 mmol/L (ref 135–145)
Total Bilirubin: 0.5 mg/dL (ref 0.3–1.2)
Total Protein: 8.1 g/dL (ref 6.5–8.1)

## 2015-10-23 LAB — ACETAMINOPHEN LEVEL
ACETAMINOPHEN (TYLENOL), SERUM: 30 ug/mL (ref 10–30)
Acetaminophen (Tylenol), Serum: 50 ug/mL — ABNORMAL HIGH (ref 10–30)

## 2015-10-23 LAB — CBC
HEMATOCRIT: 42.2 % (ref 36.0–46.0)
HEMOGLOBIN: 13.7 g/dL (ref 12.0–15.0)
MCH: 31.1 pg (ref 26.0–34.0)
MCHC: 32.5 g/dL (ref 30.0–36.0)
MCV: 95.9 fL (ref 78.0–100.0)
PLATELETS: 298 10*3/uL (ref 150–400)
RBC: 4.4 MIL/uL (ref 3.87–5.11)
RDW: 14 % (ref 11.5–15.5)
WBC: 8 10*3/uL (ref 4.0–10.5)

## 2015-10-23 LAB — RAPID URINE DRUG SCREEN, HOSP PERFORMED
Amphetamines: NOT DETECTED
BENZODIAZEPINES: NOT DETECTED
Barbiturates: NOT DETECTED
COCAINE: NOT DETECTED
Opiates: POSITIVE — AB
TETRAHYDROCANNABINOL: NOT DETECTED

## 2015-10-23 LAB — ETHANOL: Alcohol, Ethyl (B): <5 mg/dL (ref <5)

## 2015-10-23 LAB — I-STAT BETA HCG BLOOD, ED (MC, WL, AP ONLY)

## 2015-10-23 LAB — MRSA PCR SCREENING: MRSA by PCR: NEGATIVE

## 2015-10-23 LAB — SALICYLATE LEVEL: Salicylate Lvl: <4 mg/dL (ref 2.8–30.0)

## 2015-10-23 LAB — CBG MONITORING, ED: Glucose-Capillary: 119 mg/dL — ABNORMAL HIGH (ref 65–99)

## 2015-10-23 MED ORDER — ACETYLCYSTEINE LOAD VIA INFUSION: 150.0000 mg/kg | Freq: Once | INTRAVENOUS | Status: DC

## 2015-10-23 MED ORDER — ENOXAPARIN SODIUM 40 MG/0.4ML ~~LOC~~ SOLN
40.0000 mg | SUBCUTANEOUS | Status: DC
  Administered 2015-10-23 – 2015-10-26 (×4): 40 mg via SUBCUTANEOUS
  Filled 2015-10-23 (×4): qty 0.4

## 2015-10-23 MED ORDER — NALOXONE HCL 2 MG/2ML IJ SOSY
1.0000 mg | PREFILLED_SYRINGE | Freq: Once | INTRAMUSCULAR | Status: AC
  Administered 2015-10-23: 1 mg via INTRAVENOUS
  Filled 2015-10-23: qty 2

## 2015-10-23 MED ORDER — DEXTROSE 5 % IV SOLN
15.0000 mg/kg/h | INTRAVENOUS | Status: DC
  Filled 2015-10-23: qty 200

## 2015-10-23 MED ORDER — NALOXONE HCL 0.4 MG/ML IJ SOLN: 0.4000 mg | INTRAMUSCULAR | Status: DC | PRN

## 2015-10-23 MED ORDER — NALOXONE HCL 2 MG/2ML IJ SOSY
0.2500 mg/h | PREFILLED_SYRINGE | INTRAMUSCULAR | Status: DC
  Administered 2015-10-23: 0.25 mg/h via INTRAVENOUS
  Filled 2015-10-23: qty 4

## 2015-10-23 MED ORDER — SODIUM CHLORIDE 0.9 % IV SOLN
INTRAVENOUS | Status: DC
  Administered 2015-10-23: 17:00:00 via INTRAVENOUS

## 2015-10-23 MED ORDER — ACETYLCYSTEINE LOAD VIA INFUSION
150.0000 mg/kg | Freq: Once | INTRAVENOUS | Status: AC
  Administered 2015-10-23: 22455 mg via INTRAVENOUS
  Filled 2015-10-23: qty 562

## 2015-10-23 MED ORDER — SODIUM CHLORIDE 0.9 % IV BOLUS (SEPSIS)
2000.0000 mL | Freq: Once | INTRAVENOUS | Status: AC
  Administered 2015-10-23: 2000 mL via INTRAVENOUS

## 2015-10-23 NOTE — Progress Notes (Signed)
Pharmacy Brief Note  Assessment:  Obese patient ordered weight-based acetylcysteine Load + Infusion from APAP overdose order set.  RN called with concern for high loading dose.  Spoke with MD who was amenable to reducing load to flat 15 g/60 min (as recommended by product labeling) and discontinuing IV infusion given low/improving APAP levels. Order set dosing is locked to weight-based dose using the appropriate order set so unable to enter correct order and RN stated already started infusion.    Plan: With RN, calculated appropriate volume to deliver for 15 g load based on 40 mg/ml concentration = 375 ml.  Confirmed that this should be given over 60 minutes and then no infusion to continue.  RN to document actual dose delivered and rate in Epic.  Bernadene Personrew Zymarion Favorite, PharmD, BCPS Pager: [phone] 10/23/2015, 3:10 PM

## 2015-10-23 NOTE — ED Notes (Signed)
Bed: WA15 Expected date:  Expected time:  Means of arrival:  Comments: EMS-OD 

## 2015-10-23 NOTE — Progress Notes (Signed)
Informed Dr. Roda ShuttersXu pt arrived to room 1238.  Erick Blinksuchman, Akul Leggette D, RN

## 2015-10-23 NOTE — ED Notes (Signed)
Father of patient, Nicole Walters, can be reached at cell phone #: [phone].

## 2015-10-23 NOTE — Progress Notes (Signed)
2 BAGS OF CLOTHING AND PURSE PUT INTO LOCKER 26

## 2015-10-23 NOTE — ED Notes (Signed)
Patient has two bags of belongings and her pocket book in locker 26.

## 2015-10-23 NOTE — ED Notes (Signed)
Patient from home where she lives alone according to father.  Father states patient called him earlier this morning to "say goodbye."  Father states this is the first time patient has tried to harm herself to his knowledge.  Patient apparently took a large quantity of Vicodin 5/325, Lunesta and Ambien.  Father states patient works night shift at a [employer] and has trouble sleeping.  Patient is somewhat tachycardic on arrival, skin is clammy.  Pupils 3mm, but responsive.  She is responsive to voice and pain, but has periods of apnea.  Patient on cardiac monitor with sitter at bedside.

## 2015-10-23 NOTE — ED Notes (Addendum)
Per EMS - patient comes from home where she lives alone.  Patient called her dad today and expressed SI.  Dad called 911.  Patient states she took 7760 5/325 Vicodin, 20 12.5 mg Ambien and 30 3 mg Lunesta @9 :15 today.  Patient was alert & oriented on EMS arrival, now sedated.  Patient's vitals 108/64, HR 99, 98% on RA.  Patient responsive to voice and moved from EMS stretcher to ED stretcher without assistance, but exceptionally sleepy.  CBG 114.

## 2015-10-23 NOTE — H&P (Signed)
History and Physical  Nicole Walters ZOX:[id number] DOB: 1970-07-29 DOA: 10/23/2015  Referring physician: EDP PCP: Allean Found, MD   Chief Complaint: drug overdose, suicidal ideation  HPI: Nicole Walters is a 45 y.o. 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 Per EMS she was alert and oriented on ems arrival, but she became drowsy upon arrival to the ED,  she is put on oxygen supplement, her respiration rate was around 11 -14 initially, at one point in the ED ,her respiration rate was done to 9, her vital signs has been otherwise stable,her abg showed c02 retention pco2 54, ph 7.3, initial Tylenol level at 10;45is 50, repeat tylenol level (at 4hrs) is decreasing at 30,  cr elevation at 1.10, cr in 2012 was normal, she has not been able to produce urine , urine sample was not collected, EDP contacted poison control who recommended one dose of acetylcysteine, hospitalist called to admit the patient.  Patient is still very drowsy, not able to provide detail history, but she is oriented to person. hpi obtained from chart review and talking to EDP and patient's family.   Review of Systems:  Detail per HPI, Review of systems are otherwise negative  Past Medical History  Diagnosis Date  . GERD (gastroesophageal reflux disease)   . Hypercholesterolemia    Past Surgical History  Procedure Laterality Date  . Cholecystectomy    . Oophorectomy      unsure location   Social History:  reports that she has never smoked. She has never used smokeless tobacco. She reports that she does not  drink alcohol or use illicit drugs. Patient lives at home & is able to participate in activities of daily living independently   Allergies  Allergen Reactions  . Levaquin [Levofloxacin] Other (See Comments)    Severe leg cramps    History reviewed. No pertinent family history.    Prior to Admission medications   Medication Sig Start Date End Date Taking? Authorizing Provider  cyclobenzaprine (FLEXERIL) 10 MG tablet Take 10 mg by mouth daily as needed for muscle spasms.  01/10/13  Yes Historical Provider, MD  Eszopiclone (ESZOPICLONE) 3 MG TABS Take 3 mg by mouth at bedtime. Take immediately before bedtime   Yes Historical Provider, MD  HYDROcodone-acetaminophen (NORCO/VICODIN) 5-325 MG per tablet Take 1 tablet by mouth every 6 (six) hours as needed for severe pain. 03/17/13  Yes Jennifer Piepenbrink, PA-C  zolpidem (AMBIEN CR) 12.5 MG CR tablet Take 12.5 mg by mouth at bedtime as needed for sleep.   Yes Historical Provider, MD  acetaminophen (TYLENOL) 500 MG tablet Take 1,000 mg by mouth 2 (two) times daily as needed (pain).    Historical Provider, MD  buPROPion (WELLBUTRIN XL) 300 MG 24 hr tablet Take 300 mg by mouth daily.  02/02/13   Historical Provider, MD  Calcium Carb-Cholecalciferol (CALCIUM 600 + D PO) Take 1 tablet by mouth daily.    Historical Provider, MD  esomeprazole (NEXIUM) 40 MG capsule Take 40 mg by mouth daily at 12 noon.  Historical Provider, MD  FLUoxetine (PROZAC) 40 MG capsule Take 40 mg by mouth daily.  03/04/13   Historical Provider, MD  Glucosamine-Chondroit-Vit C-Mn (GLUCOSAMINE-CHONDROITIN) CAPS Take 2 capsules by mouth daily. Glucosamine 1500 mg/ chondroiton 1200 mg    Historical Provider, MD  ibuprofen (ADVIL,MOTRIN) 800 MG tablet Take 1 tablet (800 mg total) by mouth 3 (three) times daily. 08/01/14   Emilia Beck, PA-C  levonorgestrel (MIRENA) 20 MCG/24HR IUD 1 each by Intrauterine route once. Implanted November or December 2013    Historical Provider, MD    loratadine (CLARITIN) 10 MG tablet Take 10 mg by mouth daily.    Historical Provider, MD  lovastatin (MEVACOR) 20 MG tablet Take 20 mg by mouth daily.  01/25/13   Historical Provider, MD  Multiple Vitamin (MULTIVITAMIN WITH MINERALS) TABS tablet Take 1 tablet by mouth daily.    Historical Provider, MD  rizatriptan (MAXALT) 10 MG tablet Take 10 mg by mouth as needed for migraine. May take 2nd tablet if needed - no more than 2 tablets in 24 hours 02/10/13   Historical Provider, MD  zolpidem (AMBIEN) 10 MG tablet Take 10 mg by mouth at bedtime as needed for sleep.  01/26/13   Historical Provider, MD    Physical Exam: BP 108/70 mmHg  Pulse 67  Resp 11  Wt 149.687 kg (330 lb)  SpO2 99%  General:  Drowsy, not able to finish conversation due to falling back to sleep , obese Eyes: PERRL ENT: unremarkable Neck: supple, no JVD Cardiovascular: RRR Respiratory: CTABL Abdomen: soft/ND/ND, positive bowel sounds Skin: no rash Musculoskeletal:  No edema Psychiatric: not able to access  Neurologic: drowsy, no focal findings            Labs on Admission:  Basic Metabolic Panel:  Recent Labs Lab 10/23/15 1045  NA 137  K 3.8  CL 102  CO2 25  GLUCOSE 114*  BUN 11  CREATININE 1.10*  CALCIUM 9.0   Liver Function Tests:  Recent Labs Lab 10/23/15 1045  AST 24  ALT 18  ALKPHOS 101  BILITOT 0.5  PROT 8.1  ALBUMIN 4.0   No results for input(s): LIPASE, AMYLASE in the last 168 hours. No results for input(s): AMMONIA in the last 168 hours. CBC:  Recent Labs Lab 10/23/15 1045  WBC 8.0  HGB 13.7  HCT 42.2  MCV 95.9  PLT 298   Cardiac Enzymes: No results for input(s): CKTOTAL, CKMB, CKMBINDEX, TROPONINI in the last 168 hours.  BNP (last 3 results) No results for input(s): BNP in the last 8760 hours.  ProBNP (last 3 results) No results for input(s): PROBNP in the last 8760 hours.  CBG:  Recent Labs Lab 10/23/15 1047  GLUCAP 119*    Radiological Exams on  Admission: No results found.  EKG: Independently reviewed. Sinus rhythm, QTc 467, no acute st/t changes  Assessment/Plan Present on Admission:  **None**  Drug overdose: with 60 5/325 Vicodin, 20 12.5 mg Ambien and 30 3 mg Lunesta tylenol level decreasing, s/p acetylcysteine, poison control following Sitter reported patient responded to narcan x1, patient remain drowsy , will start narcan drip, remain on o2 supplement, continue ivf  Respiratory acidosis with co2 retention: likely combination of drug over dose and obesity hypoventilation syndrome.   SI: with long history of depression per family, psych consulted  Insomina: family report patient has not had sleep study in the past, sitter reports witnessed apnea episodes.  Morbid Obesity: Body mass index is 51.67 kg/(m^2). life style changes  ckd II vs acute elevation of cr, family report pmd diagnosed patient with ckd, concentrated urine, but no infection, continue ivf, repeat bmp in am  DVT prophylaxis: lovenox  Consultants:  poison control contacted by EDP Psychiatry consult called  Code Status: full   Family Communication:  Patient  And her parent  Disposition Plan: admit to stepdown  Time spent: 75mins  Lavada Langsam MD, PhD [facility] Pager [phone] If 7PM-7AM, please contact night-coverage at [email or web address], password [email or web address]

## 2015-10-23 NOTE — Progress Notes (Signed)
CJ from poison control called and informed him about pt status.  Erick Blinksuchman, Adeola Dennen D, RN

## 2015-10-24 LAB — BASIC METABOLIC PANEL
Anion gap: 8 (ref 5–15)
BUN: 7 mg/dL (ref 6–20)
CALCIUM: 8.2 mg/dL — AB (ref 8.9–10.3)
CO2: 24 mmol/L (ref 22–32)
CREATININE: 0.76 mg/dL (ref 0.44–1.00)
Chloride: 108 mmol/L (ref 101–111)
GFR calc Af Amer: >60 mL/min (ref >60)
GFR calc non Af Amer: >60 mL/min (ref >60)
GLUCOSE: 73 mg/dL (ref 65–99)
Potassium: 3.8 mmol/L (ref 3.5–5.1)
Sodium: 140 mmol/L (ref 135–145)

## 2015-10-24 LAB — HEPATIC FUNCTION PANEL
ALBUMIN: 3.4 g/dL — AB (ref 3.5–5.0)
ALK PHOS: 78 U/L (ref 38–126)
ALT: 18 U/L (ref 14–54)
AST: 26 U/L (ref 15–41)
BILIRUBIN TOTAL: 0.4 mg/dL (ref 0.3–1.2)
Total Protein: 6.8 g/dL (ref 6.5–8.1)

## 2015-10-24 LAB — MAGNESIUM: Magnesium: 2.1 mg/dL (ref 1.7–2.4)

## 2015-10-24 LAB — TSH: TSH: 0.976 u[IU]/mL (ref 0.350–4.500)

## 2015-10-24 MED ORDER — SODIUM CHLORIDE 0.9 % IV SOLN
INTRAVENOUS | Status: AC
  Administered 2015-10-24 (×2): via INTRAVENOUS

## 2015-10-24 MED ORDER — HYDRALAZINE HCL 50 MG PO TABS
50.0000 mg | ORAL_TABLET | Freq: Three times a day (TID) | ORAL | Status: DC
  Administered 2015-10-24 – 2015-10-29 (×14): 50 mg via ORAL
  Filled 2015-10-24 (×14): qty 1

## 2015-10-24 MED ORDER — HYDRALAZINE HCL 20 MG/ML IJ SOLN: 10.0000 mg | Freq: Four times a day (QID) | INTRAMUSCULAR | Status: DC | PRN

## 2015-10-24 MED ORDER — ALUM & MAG HYDROXIDE-SIMETH 200-200-20 MG/5ML PO SUSP
30.0000 mL | ORAL | Status: DC | PRN
  Administered 2015-10-24 – 2015-10-27 (×3): 30 mL via ORAL
  Filled 2015-10-24 (×3): qty 30

## 2015-10-24 MED ORDER — ONDANSETRON HCL 4 MG/2ML IJ SOLN
4.0000 mg | Freq: Four times a day (QID) | INTRAMUSCULAR | Status: DC | PRN
  Administered 2015-10-24 (×2): 4 mg via INTRAVENOUS
  Filled 2015-10-24 (×2): qty 2

## 2015-10-24 MED ORDER — CLONAZEPAM 0.5 MG PO TABS
0.5000 mg | ORAL_TABLET | Freq: Every day | ORAL | Status: DC
  Administered 2015-10-24 – 2015-10-28 (×5): 0.5 mg via ORAL
  Filled 2015-10-24 (×5): qty 1

## 2015-10-24 NOTE — Progress Notes (Signed)
Pharmacy - Brief Note (regarding acetylcysteine)   Patient currently not on acetadote infusion,  Patient given bolus dose of acetadote 7/12 at 2pm but infusion d/c'd prior to administration. APAP level had fallen into "therapeutic" range. Per poison control since 4h post-ingestion level was 7030mcg/ml, patient falls into low risk category thus no further aceylcysteine required.  LFTs have remained WNL  Nicole Walters, PharmD, BCPS.   Pager: [phone] 10/24/2015 2:10 PM

## 2015-10-24 NOTE — Care Management Note (Signed)
Case Management Note  Patient Details  Name: Nicole Walters MRN: [id number] Date of Birth: 05/18/1970  Subjective/Objective: 45 y/o f admitted w/drug OD, SI,htn.Psych cons. From home.                   Action/Plan:d/c plan inpt psych.   Expected Discharge Date:   (UNKNOWN)               [facility] Plan:  [facility]  In-House Referral:  Clinical Social Work  Discharge planning Services     Post Acute Care Choice:    Choice offered to:     DME Arranged:    DME Agency:     HH Arranged:    HH Agency:     Status of Service:  In process, will continue to follow  If discussed at Long Length of Stay Meetings, dates discussed:    Additional Comments:  Lanier ClamMahabir, Rahil Passey, RN 10/24/2015, 4:10 PM

## 2015-10-24 NOTE — Progress Notes (Signed)
PROGRESS NOTE                                                                                                                                                                                                             Patient Demographics:    Nicole Walters, is a 45 y.o. female, DOB - 12/16/1970, WUJ:[id number]  Admit date - 10/23/2015   Admitting Physician Albertine Grates, MD  Outpatient Primary MD for the patient is Allean Found, MD  LOS - 1  Chief Complaint  Patient presents with  . Drug Overdose       Brief Narrative    Nicole Walters is a 45 y.o. 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 Per EMS she was alert and oriented on ems arrival, but she became drowsy upon arrival to the ED, she is put on oxygen supplement, her respiration rate was around 11 -14 initially, at one point in the ED ,her respiration rate was done to 9, her vital signs has been otherwise stable,her abg showed c02 retention pco2 54, ph 7.3, initial Tylenol level at 10;45is 50, repeat tylenol level (at 4hrs) is decreasing at 30, cr elevation at 1.10, cr in 2012 was normal, she has not been able to produce urine , urine sample was not collected, EDP contacted poison control who recommended one dose of acetylcysteine, hospitalist called to admit the patient.  Patient is still very drowsy, not able to provide detail history, but she is oriented to person. hpi obtained from chart review and talking to EDP and patient's family.   Subjective:    Nicole Walters today has, No headache, No chest pain, No abdominal pain - No Nausea, No new weakness tingling or numbness, No Cough - SOB.    Assessment  & Plan :     1.Intentional polypharmacy with Vicodin, Ambien, Lunesta and Tylenol overdose with suicidal intention. She has  had previous suicidal attempts as well, she is on acetylcysteine protocol per pharmacy, liver enzymes stable, monitor per Tylenol overdose protocol. Mentation is close to baseline. Continue holding narcotics and offending medications, low-dose Klonopin at night to avoid abrupt withdrawal. Psych called. May require B H&H. Likely will be medically stable on 10/25/2015.  2. Acute respiratory acidosis due to decreased mentation and carbon dioxide retention. Clinically resolved.  3. Morbid obesity. Outpatient follow-up with PCP.  4. ARF due to dehydration. Resolved with IV fluids.  5. Depression. Psych consulted.  6. HTN. Placed on hydralazine.    Family Communication  :  None present  Code Status :  Full  Diet : Soft diet  Disposition Plan  :  Per psych, likely she will be medically stable on 10/25/2015  Consults  :  Psych  Procedures  :  None  DVT Prophylaxis  :  Lovenox   Lab Results  Component Value Date   PLT 298 10/23/2015    Inpatient Medications  Scheduled Meds: . enoxaparin (LOVENOX) injection  40 mg Subcutaneous Q24H  . hydrALAZINE  50 mg Oral Q8H   Continuous Infusions: . sodium chloride 75 mL/hr at 10/24/15 0600   PRN Meds:.hydrALAZINE, naLOXone (NARCAN)  injection  Antibiotics  :    Anti-infectives    None         Objective:   Filed Vitals:   10/24/15 0500 10/24/15 0700 10/24/15 0736 10/24/15 0800  BP: 145/115 118/59  107/67  Pulse: 81 80  83  Temp:   98.2 F (36.8 C)   TempSrc:   Oral   Resp: 17 15  19   Height:      Weight:      SpO2: 100% 99%  99%    Wt Readings from Last 3 Encounters:  10/23/15 151.2 kg (333 lb 5.4 oz)  08/01/14 154.932 kg (341 lb 9 oz)      Intake/Output Summary (Last 24 hours) at 10/24/15 1043 Last data filed at 10/24/15 1021  Gross per 24 hour  Intake 1478.68 ml  Output   1275 ml  Net 203.68 ml     Physical Exam  Awake Alert, Oriented X 3, No new F.N deficits, flat affect Morgan.AT,PERRAL Supple Neck,No JVD, No cervical lymphadenopathy appriciated.  Symmetrical Chest wall movement, Good air movement bilaterally, CTAB RRR,No Gallops,Rubs or new Murmurs, No Parasternal Heave +ve B.Sounds, Abd Soft, No tenderness, No organomegaly appriciated, No rebound - guarding or rigidity. No Cyanosis, Clubbing or edema, No new Rash or bruise       Data Review:    CBC  Recent Labs Lab 10/23/15 1045  WBC 8.0  HGB 13.7  HCT 42.2  PLT 298  MCV 95.9  MCH 31.1  MCHC 32.5  RDW 14.0    Chemistries   Recent Labs Lab 10/23/15 1045 10/24/15 0529  NA 137 140  K 3.8 3.8  CL 102 108  CO2 25 24  GLUCOSE 114* 73  BUN 11 7  CREATININE 1.10* 0.76  CALCIUM 9.0 8.2*  MG  --  2.1  AST 24 26  ALT 18 18  ALKPHOS 101 78  BILITOT 0.5 0.4   ------------------------------------------------------------------------------------------------------------------ No results for input(s): CHOL, HDL, LDLCALC, TRIG, CHOLHDL, LDLDIRECT in the last 72 hours.  No results found for: HGBA1C ------------------------------------------------------------------------------------------------------------------  Recent Labs  10/24/15 0529  TSH 0.976   ------------------------------------------------------------------------------------------------------------------ No results for input(s): VITAMINB12, FOLATE, FERRITIN, TIBC, IRON, RETICCTPCT in the last 72 hours.  Coagulation profile No results for input(s): INR, PROTIME in  the last 168 hours.  No results for input(s): DDIMER in the last 72 hours.  Cardiac Enzymes No results for input(s): CKMB, TROPONINI, MYOGLOBIN in the last 168 hours.  Invalid input(s):  CK ------------------------------------------------------------------------------------------------------------------ No results found for: BNP  Micro Results Recent Results (from the past 240 hour(s))  MRSA PCR Screening     Status: None   Collection Time: 10/23/15  4:30 PM  Result Value Ref Range Status   MRSA by PCR NEGATIVE NEGATIVE Final    Comment:        The GeneXpert MRSA Assay (FDA approved for NASAL specimens only), is one component of a comprehensive MRSA colonization surveillance program. It is not intended to diagnose MRSA infection nor to guide or monitor treatment for MRSA infections.     Radiology Reports No results found.  Time Spent in minutes  30   Susa RaringSINGH,Marymargaret Kirker K M.D on 10/24/2015 at 10:43 AM  Between 7am to 7pm - Pager - [phone]  After 7pm go to [email or web address] - password [email or web address]  [facility] -  Office  [phone]

## 2015-10-24 NOTE — Progress Notes (Signed)
Patient received from ICU. Agree with previous shift assessment. Pt in no signs of distress. No complaints/concerns. Arrived with sitter, suicide precautions continued.  Earnest ConroyBrooke M. Clelia CroftShaw, RN

## 2015-10-25 DIAGNOSIS — T1491 Suicide attempt: Secondary | ICD-10-CM

## 2015-10-25 DIAGNOSIS — T391X2A Poisoning by 4-Aminophenol derivatives, intentional self-harm, initial encounter: Principal | ICD-10-CM

## 2015-10-25 DIAGNOSIS — F332 Major depressive disorder, recurrent severe without psychotic features: Secondary | ICD-10-CM

## 2015-10-25 DIAGNOSIS — F4312 Post-traumatic stress disorder, chronic: Secondary | ICD-10-CM

## 2015-10-25 DIAGNOSIS — F5104 Psychophysiologic insomnia: Secondary | ICD-10-CM

## 2015-10-25 DIAGNOSIS — T50902D Poisoning by unspecified drugs, medicaments and biological substances, intentional self-harm, subsequent encounter: Secondary | ICD-10-CM

## 2015-10-25 DIAGNOSIS — T426X2A Poisoning by other antiepileptic and sedative-hypnotic drugs, intentional self-harm, initial encounter: Secondary | ICD-10-CM

## 2015-10-25 LAB — COMPREHENSIVE METABOLIC PANEL
ALK PHOS: 81 U/L (ref 38–126)
ALT: 19 U/L (ref 14–54)
ANION GAP: 5 (ref 5–15)
AST: 22 U/L (ref 15–41)
Albumin: 3.1 g/dL — ABNORMAL LOW (ref 3.5–5.0)
BUN: 7 mg/dL (ref 6–20)
CALCIUM: 8.5 mg/dL — AB (ref 8.9–10.3)
CO2: 27 mmol/L (ref 22–32)
CREATININE: 0.81 mg/dL (ref 0.44–1.00)
Chloride: 108 mmol/L (ref 101–111)
Glucose, Bld: 110 mg/dL — ABNORMAL HIGH (ref 65–99)
Potassium: 4.4 mmol/L (ref 3.5–5.1)
Sodium: 140 mmol/L (ref 135–145)
TOTAL PROTEIN: 6.2 g/dL — AB (ref 6.5–8.1)
Total Bilirubin: 0.4 mg/dL (ref 0.3–1.2)

## 2015-10-25 MED ORDER — TRAMADOL HCL 50 MG PO TABS
50.0000 mg | ORAL_TABLET | Freq: Once | ORAL | Status: AC
  Administered 2015-10-25: 50 mg via ORAL
  Filled 2015-10-25: qty 1

## 2015-10-25 MED ORDER — IBUPROFEN 200 MG PO TABS
400.0000 mg | ORAL_TABLET | Freq: Four times a day (QID) | ORAL | Status: DC | PRN
  Administered 2015-10-25 – 2015-10-27 (×4): 400 mg via ORAL
  Filled 2015-10-25 (×4): qty 2
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
## 2015-10-25 NOTE — Progress Notes (Signed)
PROGRESS NOTE                                                                                                                                                                                                             Patient Demographics:    Nicole Walters, is a 45 y.o. female, DOB - [birth year], JXB:[id number]  Admit date - 10/23/2015   Admitting Physician Albertine GratesFang Xu, MD  Outpatient Primary MD for the patient is Allean FoundSMITH,CANDACE THIELE, MD  LOS - 2  Chief Complaint  Patient presents with  . Drug Overdose       Brief Narrative    Nicole Walters is a 45 y.o. 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 Per EMS she was alert and oriented on ems arrival, but she became drowsy upon arrival to the ED, she is put on oxygen supplement, her respiration rate was around 11 -14 initially, at one point in the ED ,her respiration rate was done to 9, her vital signs has been otherwise stable,her abg showed c02 retention pco2 54, ph 7.3, initial Tylenol level at 10;45is 50, repeat tylenol level (at 4hrs) is decreasing at 30, cr elevation at 1.10, cr in 2012 was normal, she has not been able to produce urine , urine sample was not collected, EDP contacted poison control who recommended one dose of acetylcysteine, hospitalist called to admit the patient.  Patient is still very drowsy, not able to provide detail history, but she is oriented to person. hpi obtained from chart review and talking to EDP and patient's family.   Subjective:    Nicole Walters today has, No headache, No chest pain, No abdominal pain - No Nausea, No new weakness tingling or numbness, No Cough - SOB.    Assessment  & Plan :     1.Intentional polypharmacy with Vicodin, Ambien, Lunesta and Tylenol overdose with suicidal intention. She has  had previous suicidal attempts as well, she was on acetylcysteine protocol per pharmacy/EDP, liver enzymes stable.   Had developed toxic encephalopathy due to what he pharmacy overdose and now stable, Mentation is at baseline. Continue holding narcotics and offending medications, low-dose Klonopin at avoid avoid abrupt withdrawal. Psych called await their input. May require [facility]. She is medically stable at this time.  2. Acute respiratory acidosis due to decreased mentation and carbon dioxide retention. Clinically resolved.  3. Morbid obesity. Outpatient follow-up with PCP.  4. ARF due to dehydration. Resolved with IV fluids.  5. Depression. Psych consulted await input. May require [facility] depending on psych input.  6. HTN. Placed on hydralazine.    Family Communication  :  None present  Code Status :  Full  Diet : heart Healthy diet  Disposition Plan  :  Per psych   Consults  :  Psych  Procedures  :  None  DVT Prophylaxis  :  Lovenox   Lab Results  Component Value Date   PLT 298 10/23/2015    Inpatient Medications  Scheduled Meds: . clonazePAM  0.5 mg Oral QHS  . enoxaparin (LOVENOX) injection  40 mg Subcutaneous Q24H  . hydrALAZINE  50 mg Oral Q8H   Continuous Infusions: . sodium chloride 50 mL/hr at 10/24/15 1900   PRN Meds:.alum & mag hydroxide-simeth, hydrALAZINE, ibuprofen, naLOXone (NARCAN)  injection, ondansetron (ZOFRAN) IV  Antibiotics  :    Anti-infectives    None         Objective:   Filed Vitals:   10/24/15 1000 10/24/15 1246 10/24/15 2111 10/25/15 0541  BP:  112/60 135/74 143/78  Pulse: 85 78 80 83  Temp:  98.4 F (36.9 C) 98.2 F (36.8 C) 98.9 F (37.2 C)  TempSrc:   Oral Oral Oral  Resp:  15 16 16   Height:      Weight:      SpO2: 100% 94% 98% 99%    Wt Readings from Last 3 Encounters:  10/23/15 151.2 kg (333 lb 5.4 oz)  08/01/14 154.932 kg (341 lb 9 oz)     Intake/Output Summary (Last 24 hours) at 10/25/15 1052 Last data filed at 10/25/15 0836  Gross per 24 hour  Intake    855 ml  Output   2250 ml  Net  -1395 ml     Physical Exam  Awake Alert, Oriented X 3, No new F.N deficits, flat affect Holly.AT,PERRAL Supple Neck,No JVD, No cervical lymphadenopathy appriciated.  Symmetrical Chest wall movement, Good air movement bilaterally, CTAB RRR,No Gallops,Rubs or new Murmurs, No Parasternal Heave +ve B.Sounds, Abd Soft, No tenderness, No organomegaly appriciated, No rebound - guarding or rigidity. No Cyanosis, Clubbing or edema, No new Rash or bruise       Data Review:    CBC  Recent Labs Lab 10/23/15 1045  WBC 8.0  HGB 13.7  HCT 42.2  PLT 298  MCV 95.9  MCH 31.1  MCHC 32.5  RDW 14.0    Chemistries   Recent Labs Lab 10/23/15 1045 10/24/15 0529 10/25/15 0427  NA 137 140 140  K 3.8 3.8 4.4  CL 102 108 108  CO2 25 24 27   GLUCOSE 114* 73 110*  BUN 11 7 7   CREATININE 1.10* 0.76 0.81  CALCIUM 9.0 8.2* 8.5*  MG  --  2.1  --   AST 24 26 22   ALT 18 18 19   ALKPHOS 101 78 81  BILITOT 0.5 0.4 0.4   ------------------------------------------------------------------------------------------------------------------ No results for  input(s): CHOL, HDL, LDLCALC, TRIG, CHOLHDL, LDLDIRECT in the last 72 hours.  No results found for: HGBA1C ------------------------------------------------------------------------------------------------------------------  Recent Labs  10/24/15 0529  TSH 0.976   ------------------------------------------------------------------------------------------------------------------ No results for input(s): VITAMINB12, FOLATE, FERRITIN, TIBC, IRON, RETICCTPCT in the last 72 hours.  Coagulation  profile No results for input(s): INR, PROTIME in the last 168 hours.  No results for input(s): DDIMER in the last 72 hours.  Cardiac Enzymes No results for input(s): CKMB, TROPONINI, MYOGLOBIN in the last 168 hours.  Invalid input(s): CK ------------------------------------------------------------------------------------------------------------------ No results found for: BNP  Micro Results Recent Results (from the past 240 hour(s))  MRSA PCR Screening     Status: None   Collection Time: 10/23/15  4:30 PM  Result Value Ref Range Status   MRSA by PCR NEGATIVE NEGATIVE Final    Comment:        The GeneXpert MRSA Assay (FDA approved for NASAL specimens only), is one component of a comprehensive MRSA colonization surveillance program. It is not intended to diagnose MRSA infection nor to guide or monitor treatment for MRSA infections.     Radiology Reports No results found.  Time Spent in minutes  30   Susa Raring K M.D on 10/25/2015 at 10:52 AM  Between 7am to 7pm - Pager - [phone]  After 7pm go to [email or web address] - password [facility]  [facility] -  Office  [phone]
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
## 2015-10-25 NOTE — Progress Notes (Signed)
M.Lynch, NP made aware of blood found on TP when pt wiped after urinating. Will continue to monitor pt closely. Mardene CelesteAsaro, Allisa Einspahr I

## 2015-10-26 DIAGNOSIS — T50901A Poisoning by unspecified drugs, medicaments and biological substances, accidental (unintentional), initial encounter: Secondary | ICD-10-CM

## 2015-10-26 MED ORDER — HYDRALAZINE HCL 50 MG PO TABS: 50.0000 mg | ORAL_TABLET | Freq: Three times a day (TID) | ORAL | Status: DC

## 2015-10-26 MED ORDER — SUMATRIPTAN SUCCINATE 50 MG PO TABS
50.0000 mg | ORAL_TABLET | ORAL | Status: DC | PRN
  Administered 2015-10-26 – 2015-10-28 (×4): 50 mg via ORAL
  Filled 2015-10-26 (×6): qty 1

## 2015-10-26 MED ORDER — CLONAZEPAM 0.5 MG PO TABS: 0.5000 mg | ORAL_TABLET | Freq: Every day | ORAL | Status: DC

## 2015-10-26 NOTE — Discharge Summary (Signed)
Physician Discharge Summary  Nicole Walters ZOX:[id number] DOB: [birth year] DOA: 10/23/2015  PCP: Allean Found, MD  Admit date: 10/23/2015 Discharge date: 10/26/2015  Admitted From: [facility] Disposition: inpatient psychiatry facility.  Recommendations for Outpatient Follow-up:  1. Follow up with PCP in 1-2 weeks 2. Please obtain BMP/CBC in one week 3. Please follow up on the following pending results:    Discharge Condition:stable. CODE STATUS:full code.  Diet recommendation: Regular /   Brief/Interim Summary: Nicole Walters is a 45 y.o. 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 psychaitry consulted and recommended inpatient psychiatry hospitalization.   Discharge Diagnoses:  Principal Problem:   Drug overdose Active Problems:   Respiratory depression  .Intentional polypharmacy with Vicodin, Ambien, Lunesta and Tylenol overdose with suicidal intention. She has had previous suicidal attempts as well, she was on acetylcysteine protocol per pharmacy/EDP, liver enzymes stable.   Had developed toxic encephalopathy due to what he pharmacy overdose and now stable, Mentation is at baseline. Continue holding narcotics and offending medications, low-dose Klonopin at avoid avoid abrupt withdrawal. Psychiatry consulted and recommended inpatient psych admission. Medically stable.   2. Acute respiratory acidosis due to decreased mentation and carbon dioxide retention. Clinically resolved.  3. Morbid obesity. Outpatient follow-up with PCP.  4. ARF due to dehydration. Resolved with IV fluids.  5. Depression. Psych consulted and recommended [facility].   6. HTN. Controlled. Marland Kitchen  Discharge Instructions     Medication List    STOP taking these medications        acetaminophen 500 MG tablet  Commonly known as:  TYLENOL     buPROPion  300 MG 24 hr tablet  Commonly known as:  WELLBUTRIN XL     cyclobenzaprine 10 MG tablet  Commonly known as:  FLEXERIL     eszopiclone 3 MG Tabs  Generic drug:  Eszopiclone     FLUoxetine 40 MG capsule  Commonly known as:  PROZAC     HYDROcodone-acetaminophen 5-325 MG tablet  Commonly known as:  NORCO/VICODIN     ibuprofen 800 MG tablet  Commonly known as:  ADVIL,MOTRIN      TAKE these medications        CALCIUM 600 + D PO  Take 1 tablet by mouth daily.     clonazePAM 0.5 MG tablet  Commonly known as:  KLONOPIN  Take 1 tablet (0.5 mg total) by mouth at bedtime.     esomeprazole 40 MG capsule  Commonly known as:  NEXIUM  Take 40 mg by mouth daily at 12 noon.     Glucosamine-Chondroitin Caps  Take 2 capsules by mouth daily. Glucosamine 1500 mg/ chondroiton 1200 mg     hydrALAZINE 50 MG tablet  Commonly known as:  APRESOLINE  Take 1 tablet (50 mg total) by mouth every 8 (eight) hours.     levonorgestrel 20 MCG/24HR IUD  Commonly known as:  MIRENA  1 each by Intrauterine route once. Implanted November or December 2013     loratadine 10 MG tablet  Commonly known as:  CLARITIN  Take 10 mg by mouth daily.     lovastatin 20 MG tablet  Commonly known as:  MEVACOR  Take 20 mg by mouth daily.     multivitamin with minerals Tabs tablet  Take 1 tablet by mouth daily.     rizatriptan 10 MG tablet  Commonly  known as:  MAXALT  Take 10 mg by mouth as needed for migraine. May take 2nd tablet if needed - no more than 2 tablets in 24 hours        Allergies  Allergen Reactions  . Levaquin [Levofloxacin] Other (See Comments)    Severe leg cramps    Consultations:  Psychiatry.    Procedures/Studies:  No results found.    Subjective: Mild migraine. But improved with imitrex.   Discharge Exam: Filed Vitals:   10/26/15 0524 10/26/15 1401  BP: 134/82 148/87  Pulse: 91 88  Temp: 98.9 F (37.2 C) 99.1 F (37.3 C)  Resp: 18 18   Filed Vitals:   10/25/15  1436 10/25/15 2130 10/26/15 0524 10/26/15 1401  BP: 127/90 135/80 134/82 148/87  Pulse: 78 80 91 88  Temp: 98.3 F (36.8 C) 98.3 F (36.8 C) 98.9 F (37.2 C) 99.1 F (37.3 C)  TempSrc: Oral Oral Oral Oral  Resp: 18 16 18 18   Height:      Weight:      SpO2: 99% 98% 97% 100%    General: Pt is alert, awake, not in acute distress Cardiovascular: RRR, S1/S2 +, no rubs, no gallops Respiratory: CTA bilaterally, no wheezing, no rhonchi Abdominal: Soft, NT, ND, bowel sounds + Extremities: no edema, no cyanosis    The results of significant diagnostics from this hospitalization (including imaging, microbiology, ancillary and laboratory) are listed below for reference.     Microbiology: Recent Results (from the past 240 hour(s))  MRSA PCR Screening     Status: None   Collection Time: 10/23/15  4:30 PM  Result Value Ref Range Status   MRSA by PCR NEGATIVE NEGATIVE Final    Comment:        The GeneXpert MRSA Assay (FDA approved for NASAL specimens only), is one component of a comprehensive MRSA colonization surveillance program. It is not intended to diagnose MRSA infection nor to guide or monitor treatment for MRSA infections.      Labs: BNP (last 3 results) No results for input(s): BNP in the last 8760 hours. Basic Metabolic Panel:  Recent Labs Lab 10/23/15 1045 10/24/15 0529 10/25/15 0427  NA 137 140 140  K 3.8 3.8 4.4  CL 102 108 108  CO2 25 24 27   GLUCOSE 114* 73 110*  BUN 11 7 7   CREATININE 1.10* 0.76 0.81  CALCIUM 9.0 8.2* 8.5*  MG  --  2.1  --    Liver Function Tests:  Recent Labs Lab 10/23/15 1045 10/24/15 0529 10/25/15 0427  AST 24 26 22   ALT 18 18 19   ALKPHOS 101 78 81  BILITOT 0.5 0.4 0.4  PROT 8.1 6.8 6.2*  ALBUMIN 4.0 3.4* 3.1*   No results for input(s): LIPASE, AMYLASE in the last 168 hours. No results for input(s): AMMONIA in the last 168 hours. CBC:  Recent Labs Lab 10/23/15 1045  WBC 8.0  HGB 13.7  HCT 42.2  MCV 95.9  PLT  298   Cardiac Enzymes: No results for input(s): CKTOTAL, CKMB, CKMBINDEX, TROPONINI in the last 168 hours. BNP: Invalid input(s): POCBNP CBG:  Recent Labs Lab 10/23/15 1047  GLUCAP 119*   D-Dimer No results for input(s): DDIMER in the last 72 hours. Hgb A1c No results for input(s): HGBA1C in the last 72 hours. Lipid Profile No results for input(s): CHOL, HDL, LDLCALC, TRIG, CHOLHDL, LDLDIRECT in the last 72 hours. Thyroid function studies  Recent Labs  10/24/15 0529  TSH 0.976   Anemia  work up No results for input(s): VITAMINB12, FOLATE, FERRITIN, TIBC, IRON, RETICCTPCT in the last 72 hours. Urinalysis    Component Value Date/Time   COLORURINE YELLOW 10/23/2015 2002   APPEARANCEUR CLEAR 10/23/2015 2002   LABSPEC 1.034* 10/23/2015 2002   PHURINE 5.0 10/23/2015 2002   GLUCOSEU NEGATIVE 10/23/2015 2002   HGBUR NEGATIVE 10/23/2015 2002   BILIRUBINUR NEGATIVE 10/23/2015 2002   KETONESUR >80* 10/23/2015 2002   PROTEINUR NEGATIVE 10/23/2015 2002   UROBILINOGEN 0.2 01/11/2011 1102   NITRITE NEGATIVE 10/23/2015 2002   LEUKOCYTESUR NEGATIVE 10/23/2015 2002   Sepsis Labs Invalid input(s): PROCALCITONIN,  WBC,  LACTICIDVEN Microbiology Recent Results (from the past 240 hour(s))  MRSA PCR Screening     Status: None   Collection Time: 10/23/15  4:30 PM  Result Value Ref Range Status   MRSA by PCR NEGATIVE NEGATIVE Final    Comment:        The GeneXpert MRSA Assay (FDA approved for NASAL specimens only), is one component of a comprehensive MRSA colonization surveillance program. It is not intended to diagnose MRSA infection nor to guide or monitor treatment for MRSA infections.      Time coordinating discharge: Over 30 minutes  SIGNED:   Kathlen Mody, MD  [facility] 10/26/2015, 2:51 PM Pager [phone]  If 7PM-7AM, please contact night-coverage [email or web address] Password [email or web address]

## 2015-10-26 NOTE — Progress Notes (Signed)
Disposition CSW completed patient referrals to the following inpatient psych facilities:  [facility]  [facility] at [facility]  CSW will continue to follow patient for placement needs.  Seward SpeckLeo Stanley Helmuth [facility] LLCCSW,LCAS Behavioral Health Disposition CSW [phone]

## 2015-10-27 MED ORDER — KETOROLAC TROMETHAMINE 30 MG/ML IJ SOLN
30.0000 mg | Freq: Once | INTRAMUSCULAR | Status: AC
  Administered 2015-10-27: 30 mg via INTRAVENOUS
  Filled 2015-10-27: qty 1

## 2015-10-27 MED ORDER — DIPHENHYDRAMINE HCL 50 MG/ML IJ SOLN
25.0000 mg | Freq: Once | INTRAMUSCULAR | Status: AC
  Administered 2015-10-27: 25 mg via INTRAVENOUS
  Filled 2015-10-27: qty 1

## 2015-10-27 MED ORDER — PROCHLORPERAZINE EDISYLATE 5 MG/ML IJ SOLN
10.0000 mg | Freq: Once | INTRAMUSCULAR | Status: AC
  Administered 2015-10-27: 10 mg via INTRAVENOUS
  Filled 2015-10-27: qty 2

## 2015-10-27 MED ORDER — TRAMADOL HCL 50 MG PO TABS
50.0000 mg | ORAL_TABLET | Freq: Once | ORAL | Status: AC
  Administered 2015-10-27: 50 mg via ORAL
  Filled 2015-10-27: qty 1

## 2015-10-27 MED ORDER — FLUTICASONE PROPIONATE 50 MCG/ACT NA SUSP
2.0000 | Freq: Every day | NASAL | Status: DC
  Administered 2015-10-27 – 2015-10-29 (×3): 2 via NASAL
  Filled 2015-10-27: qty 16

## 2015-10-27 MED ORDER — ENOXAPARIN SODIUM 80 MG/0.8ML ~~LOC~~ SOLN
80.0000 mg | SUBCUTANEOUS | Status: DC
  Administered 2015-10-27: 80 mg via SUBCUTANEOUS
  Filled 2015-10-27: qty 0.8

## 2015-10-27 NOTE — Progress Notes (Signed)
Patient is A&O x4 and ambulatory. Pt non verbal this morning. Pt would not elaborate on reason for this admission. Sitter remains at bedside.

## 2015-10-27 NOTE — Progress Notes (Signed)
[facility]                                                                                                                                                                                                                                Patient Demographics  Nicole Walters, is a 45 y.o. female  RUE:[id number]  JYN:[id number]  DOB - 19-May-1970  Admit date - 10/23/2015  Admitting Physician Albertine Grates, MD  Outpatient Primary MD for the patient is Allean Found, MD  LOS - 4   Chief Complaint  Patient presents with  . Drug Overdose        Assessment & Plan    Patient seen briefly today due for discharge soon per Discharge done yesterday by Dr Blake Divine, no further issues, Vital signs stable, patient stable.     Medications  Scheduled Meds: . clonazePAM  0.5 mg Oral QHS  . enoxaparin (LOVENOX) injection  40 mg Subcutaneous Q24H  . fluticasone  2 spray Each Nare Daily  . hydrALAZINE  50 mg Oral Q8H   Continuous Infusions:  PRN Meds:.alum & mag hydroxide-simeth, hydrALAZINE, ibuprofen, naLOXone (NARCAN)  injection, ondansetron (ZOFRAN) IV, SUMAtriptan    Time Spent in minutes   10 minutes   Susa Raring K M.D on 10/27/2015 at 10:55 AM  Between 7am to 7pm - Pager - [phone]  After 7pm go to [email or web address] - password [email or web address]  And look for the night coverage person covering for me after hours  [facility] Office  [phone]    Subjective:   Nicole Walters today has, +ve headache, No chest pain, No abdominal pain - No Nausea, No new weakness tingling or numbness, No Cough - SOB.    Objective:   Filed Vitals:   10/26/15 1401 10/26/15 2113 10/26/15 2144 10/27/15 0516  BP: 148/87 155/94 144/86 130/79  Pulse: 88 78 84 88  Temp: 99.1 F (37.3 C) 98 F (36.7 C)  98.4 F (36.9 C)  TempSrc: Oral Oral  Oral  Resp: Height:      Weight:      SpO2: 100% 100%  98%     Wt Readings from Last 3 Encounters:  10/23/15 151.2 kg (333 lb 5.4 oz)  08/01/14 154.932 kg (341 lb 9 oz)     Intake/Output Summary (Last 24 hours) at 10/27/15 1055 Last data filed at 10/27/15 0956  Gross per 24 hour  Intake    660  ml  Output    800 ml  Net   -140 ml    Exam Awake Alert, Oriented X 3, No new F.N deficits, Flat affect [place].AT,PERRAL Supple Neck,No JVD, No cervical lymphadenopathy appriciated.  Symmetrical Chest wall movement, Good air movement bilaterally, CTAB RRR,No Gallops,Rubs or new Murmurs, No Parasternal Heave +ve B.Sounds, Abd Soft, Non tender, No organomegaly appriciated, No rebound - guarding or rigidity. No Cyanosis, Clubbing or edema, No new Rash or bruise    Data Review

## 2015-10-28 MED ORDER — KETOROLAC TROMETHAMINE 30 MG/ML IJ SOLN
30.0000 mg | Freq: Once | INTRAMUSCULAR | Status: AC
  Administered 2015-10-28: 30 mg via INTRAVENOUS
  Filled 2015-10-28: qty 1

## 2015-10-28 MED ORDER — PROCHLORPERAZINE EDISYLATE 5 MG/ML IJ SOLN
10.0000 mg | Freq: Once | INTRAMUSCULAR | Status: AC
  Administered 2015-10-28: 10 mg via INTRAVENOUS
  Filled 2015-10-28: qty 2

## 2015-10-28 MED ORDER — IBUPROFEN 200 MG PO TABS
600.0000 mg | ORAL_TABLET | Freq: Four times a day (QID) | ORAL | Status: DC | PRN
  Administered 2015-10-29: 600 mg via ORAL
  Filled 2015-10-28: qty 3

## 2015-10-28 MED ORDER — DIPHENHYDRAMINE HCL 50 MG/ML IJ SOLN
25.0000 mg | Freq: Once | INTRAMUSCULAR | Status: AC
  Administered 2015-10-28: 25 mg via INTRAVENOUS
  Filled 2015-10-28: qty 1

## 2015-10-28 MED ORDER — ENOXAPARIN SODIUM 80 MG/0.8ML ~~LOC~~ SOLN
75.0000 mg | SUBCUTANEOUS | Status: DC
Start: 2015-10-28 — End: 2015-10-29
  Administered 2015-10-28: 75 mg via SUBCUTANEOUS
  Filled 2015-10-28: qty 0.8

## 2015-10-28 NOTE — Progress Notes (Signed)
     Nicole Walters was admitted to the Hospital on 10/23/2015 and Discharged  10/28/2015 and should be excused from work/school   [facility]..  Call Susa RaringPrashant Adolf [place] MD, [facility]  [phone] with questions.  Leroy SeaSINGH,[name] K M.D on 10/28/2015,at 9:05 AM  [facility]   Office  [phone]

## 2015-10-28 NOTE — Progress Notes (Addendum)
Update: Patient insurance denied. No insurance at this time per patient.  [facility]. Facility Comptrollercalling RN for report.

## 2015-10-28 NOTE — Progress Notes (Addendum)
LCSWA [facility] is [facility] at [facility].   [facility]: No beds. AC will inform once bed available. [facility]: No beds. AC will inform once bed available. [facility] No beds. AC will inform once bed available. [facility]:No beds. AC will inform once bed available. [facility]:No beds. AC will inform once bed available.  LCSWA, Waiting for bed availability at this time. Nicole BarrackNicole Shandricka Walters, Nicole MajorsLCSWA, MSW Clinical Social Worker 5E and Psychiatric Service Line [phone] 10/28/2015  10:29 AM

## 2015-10-28 NOTE — Progress Notes (Signed)
[facility]                                                                                                                                                                                                                                Patient Demographics  Nicole Walters, is a 45 y.o. female  RUE:[id number]  JYN:[id number]  DOB - 10/17/1970  Admit date - 10/23/2015  Admitting Physician Albertine Grates, MD  Outpatient Primary MD for the patient is Allean Found, MD  LOS - 5   Chief Complaint  Patient presents with  . Drug Overdose        Assessment & Plan    Patient seen briefly today due for discharge soon per Discharge done on 10-27-15 by Dr Blake Divine, no further issues, Vital signs stable, patient stable except for mild Narcotic withdrawal headache which has been treated.     Medications  Scheduled Meds: . clonazePAM  0.5 mg Oral QHS  . enoxaparin (LOVENOX) injection  75 mg Subcutaneous Q24H  . fluticasone  2 spray Each Nare Daily  . hydrALAZINE  50 mg Oral Q8H   Continuous Infusions:  PRN Meds:.alum & mag hydroxide-simeth, hydrALAZINE, ibuprofen, naLOXone (NARCAN)  injection, ondansetron (ZOFRAN) IV, SUMAtriptan    Time Spent in minutes   10 minutes   Susa Raring K M.D on 10/28/2015 at 10:58 AM  Between 7am to 7pm - Pager - [phone]  After 7pm go to [email or web address] - password [email or web address]  And look for the night coverage person covering for me after hours  [facility] Office  [phone]    Subjective:   Nicole Walters today has, +ve mild Generalized headache, No chest pain, No abdominal pain - No Nausea, No new weakness tingling or numbness, No Cough - SOB.    Objective:   Filed Vitals:   10/27/15 0516 10/27/15 1500 10/27/15 2142 10/28/15 0520  BP: 130/79 150/84 176/88 132/80  Pulse: 88 74 88 92  Temp: 98.4 F (36.9 C) 97.4 F (36.3 C) 97.7 F (36.5 C) 97.9 F (36.6  C)  TempSrc: Oral Oral Oral Oral  Resp: Height:      Weight:      SpO2: 98% 100% 99% 98%    Wt Readings from Last 3 Encounters:  10/23/15 151.2 kg (333 lb 5.4 oz)  08/01/14 154.932 kg (341 lb 9 oz)     Intake/Output Summary (Last 24 hours) at 10/28/15 1058 Last  data filed at 10/28/15 0824  Gross per 24 hour  Intake    840 ml  Output      0 ml  Net    840 ml    Exam Awake Alert, Oriented X 3, No new F.N deficits, Flat affect [place].AT,PERRAL Supple Neck,No JVD, No cervical lymphadenopathy appriciated.  Symmetrical Chest wall movement, Good air movement bilaterally, CTAB RRR,No Gallops,Rubs or new Murmurs, No Parasternal Heave +ve B.Sounds, Abd Soft, Non tender, No organomegaly appriciated, No rebound - guarding or rigidity. No Cyanosis, Clubbing or edema, No new Rash or bruise    Data Review

## 2015-10-29 ENCOUNTER — Inpatient Hospital Stay (HOSPITAL_COMMUNITY)
Admission: AD | Admit: 2015-10-29 | Discharge: 2015-11-01 | DRG: 885 | Disposition: A | Discharge status: Home / Self Care | Payer: Commercial Managed Care - PPO | Source: Intra-hospital | Attending: Psychiatry | Admitting: Psychiatry

## 2015-10-29 ENCOUNTER — Encounter (HOSPITAL_COMMUNITY): Payer: Self-pay | Admitting: *Deleted

## 2015-10-29 DIAGNOSIS — F329 Major depressive disorder, single episode, unspecified: Secondary | ICD-10-CM | POA: Diagnosis present

## 2015-10-29 DIAGNOSIS — G4700 Insomnia, unspecified: Secondary | ICD-10-CM | POA: Diagnosis present

## 2015-10-29 DIAGNOSIS — F332 Major depressive disorder, recurrent severe without psychotic features: Secondary | ICD-10-CM | POA: Diagnosis present

## 2015-10-29 DIAGNOSIS — R0689 Other abnormalities of breathing: Secondary | ICD-10-CM | POA: Diagnosis not present

## 2015-10-29 MED ORDER — MAGNESIUM HYDROXIDE 400 MG/5ML PO SUSP: 30.0000 mL | Freq: Every day | ORAL | Status: DC | PRN

## 2015-10-29 MED ORDER — SUMATRIPTAN SUCCINATE 50 MG PO TABS
50.0000 mg | ORAL_TABLET | ORAL | Status: DC | PRN
  Administered 2015-10-31: 50 mg via ORAL
  Filled 2015-10-29: qty 1

## 2015-10-29 MED ORDER — HYDRALAZINE HCL 50 MG PO TABS
50.0000 mg | ORAL_TABLET | Freq: Three times a day (TID) | ORAL | Status: DC
  Administered 2015-10-29 – 2015-11-01 (×9): 50 mg via ORAL
  Filled 2015-10-29 (×15): qty 1

## 2015-10-29 MED ORDER — CLONAZEPAM 0.5 MG PO TABS
0.5000 mg | ORAL_TABLET | Freq: Every day | ORAL | Status: DC
  Administered 2015-10-29: 0.5 mg via ORAL
  Filled 2015-10-29: qty 1

## 2015-10-29 MED ORDER — ALUM & MAG HYDROXIDE-SIMETH 200-200-20 MG/5ML PO SUSP: 30.0000 mL | ORAL | Status: DC | PRN

## 2015-10-29 MED ORDER — FLUTICASONE PROPIONATE 50 MCG/ACT NA SUSP
2.0000 | Freq: Every day | NASAL | Status: DC
  Administered 2015-10-31 – 2015-11-01 (×2): 2 via NASAL
  Filled 2015-10-29: qty 16

## 2015-10-29 MED ORDER — ACETAMINOPHEN 325 MG PO TABS
650.0000 mg | ORAL_TABLET | Freq: Four times a day (QID) | ORAL | Status: DC | PRN
  Administered 2015-10-29: 650 mg via ORAL
  Filled 2015-10-29: qty 2

## 2015-10-29 MED ORDER — IBUPROFEN 600 MG PO TABS
600.0000 mg | ORAL_TABLET | Freq: Four times a day (QID) | ORAL | Status: DC | PRN
  Administered 2015-10-29 – 2015-10-31 (×3): 600 mg via ORAL
  Filled 2015-10-29 (×3): qty 1

## 2015-10-29 MED ORDER — ONDANSETRON HCL 4 MG/2ML IJ SOLN: 4.0000 mg | Freq: Four times a day (QID) | INTRAMUSCULAR | Status: DC | PRN

## 2015-10-29 NOTE — Progress Notes (Addendum)
LCSWA faxed clinical [facility] Patient is [facility] at this [facility].   [facility]: Patient has been Accepted. [facility]: No beds. AC will inform once bed available. [facility]: Denied. AC reports: no insurance.  [facility]:No beds. AC will inform once bed available. [facility]:No beds. AC will inform once bed available.  Will follow up with patient to sign involuntary consent form.  Vivi BarrackNicole Johnattan Strassman, Theresia MajorsLCSWA, MSW Clinical Social Worker 5E and Psychiatric Service Line [phone] 10/29/2015  9:57 AM

## 2015-10-29 NOTE — Progress Notes (Signed)
D: Patient alert and oriented x 4. Patient denies pain/SI/HI/AVH. [facility]. That things in her life was stressful and she did not know what to do. Patient states she knows what she did was not the right thing but at the time she thought is was best. Patient states she has her parents as support system and is planning on staying with them when she is discharged.  A: Staff to monitor Q 15 mins for safety. Encouragement and support offered. Scheduled medications administered per orders. R: Patient remains safe on the unit. Patient attended group tonight. Patient visible on hte unit and interacting with peers. Patient taking administered medications.

## 2015-10-29 NOTE — Progress Notes (Signed)
Patient is A&Ox4, ambulatory. Sitter at bedside

## 2015-10-29 NOTE — Progress Notes (Signed)
Admission Note:  45 year old female who presents voluntary, in no acute distress, for the treatment of SI and Depression following an overdose of "prescription pills" with the intent to commit suicide.  Patient appears anxious and tearful. Patient was calm and cooperative with admission process. Patient currently denies SI and contracts for safety upon admission. Patient denies AVH.  Patient reports being "stressed out at work".  Patient feels like co-workers are not "pulling their weight" which results in patient having to do more work.  Patient identifies additional stressor as "co-workers girlfriend dying in bed" recently.  Patient reports "sleep deprivation" stating that when she takes medication she can get "3-4 hours" of sleep daily. However, without medications patient reports that she can be up for "24-36 hours" at a time.  Patient reports that she has PMDD and reports that during that time of the month she becomes "more emotional" which, patient reports, played an intricate part in her suicide attempt.  Patient is focused on discharge and verbalizes that she has already been in the hospital a week, she is remorseful of her actions, and feels like she is ready to be discharged home.  She states that she will not be returning home right away and will be going to her parent's house and will not be alone. Patient states "I will never to anything like that again.  It was my one and only time".  Patient is requesting outpatient care. Skin was assessed and found to be clear of any abnormal marks apart from bruises to right arm, right wrist, and left arm due to IV placement. Bruise behind right leg and tattoo to left arm.  Patient searched and no contraband found, POC and unit policies explained and understanding verbalized. Consents obtained. Food and fluids offered accepted. Patient had no additional questions or concerns.

## 2015-10-29 NOTE — [facility] Group Notes (Signed)
Adult Psychoeducational Group Note  Date:  10/29/2015 Time:  9:24 PM  Group Topic/Focus:  Wrap-Up Group:   The focus of this group is to help patients review their daily goal of treatment and discuss progress on daily workbooks.  Participation Level:  Active  Participation Quality:  Appropriate  Affect:  Appropriate  Cognitive:  Appropriate  Insight: Appropriate  Engagement in Group:  Engaged  Modes of Intervention:  Discussion  Additional Comments:  Pt rated her day an 8 because she wanted to go home but she met some "nice people".  Pt stated she saw the doctor and hopes to go home soon.  Lindsay, Marjette A 10/29/2015, 9:24 PM 

## 2015-10-29 NOTE — Progress Notes (Signed)
[facility]                                                                                                                                                                                                   Patient Demographics  Nicole Walters, is a 46 y.o. female  WGN:[id number]  HQI:[id number]  DOB - 09/27/1970  Admit date - 10/23/2015  Admitting Physician Albertine Grates, MD  Outpatient Primary MD for the patient is Allean Found, MD  LOS - 6   Chief Complaint  Patient presents with  . Drug Overdose        Assessment & Plan    Patient seen briefly today due for discharge soon per Discharge done on 10-27-15 by Dr Blake Divine, no further issues, Vital signs stable, patient stable except for mild Narcotic withdrawal headache which has been treated.     Medications  Scheduled Meds: . clonazePAM  0.5 mg Oral QHS  . enoxaparin (LOVENOX) injection  75 mg Subcutaneous Q24H  . fluticasone  2 spray Each Nare Daily  . hydrALAZINE  50 mg Oral Q8H   Continuous Infusions:  PRN Meds:.alum & mag hydroxide-simeth, hydrALAZINE, ibuprofen, naLOXone (NARCAN)  injection, ondansetron (ZOFRAN) IV, SUMAtriptan    Time Spent in minutes   10 minutes   Susa Raring K M.D on 10/29/2015 at 11:31 AM  Between 7am to 7pm - Pager - [phone]  After 7pm go to [email or web address] - password [email or web address]  And look for the night coverage person covering for me after hours  [facility] Office  [phone]    Subjective:   Nicole Walters today has, +ve minimal Generalized headache, No chest pain, No abdominal pain - No Nausea, No new weakness tingling or numbness, No Cough - SOB.    Objective:   Filed Vitals:   10/28/15 1347 10/28/15 1719 10/28/15 2023 10/29/15 0639  BP: 139/74 132/69 148/92 151/89  Pulse:  101 96 82  Temp:  98.4 F (36.9 C) 98.6 F (37 C) 98.7 F (37.1 C)  TempSrc:  Oral Oral Oral  Resp:  Height:      Weight:      SpO2:  99% 98% 96%    Wt Readings from Last 3 Encounters:  10/23/15 151.2 kg (333 lb 5.4 oz)  08/01/14 154.932 kg (341 lb 9 oz)     Intake/Output Summary (Last 24 hours) at 10/29/15 1131 Last data filed at 10/29/15 0903  Gross per 24 hour  Intake    480 ml  Output      0 ml  Net    480  ml    Exam Awake Alert, Oriented X 3, No new F.N deficits, Flat affect Cameron.AT,PERRAL Supple Neck,No JVD, No cervical lymphadenopathy appriciated.  Symmetrical Chest wall movement, Good air movement bilaterally, CTAB RRR,No Gallops,Rubs or new Murmurs, No Parasternal Heave +ve B.Sounds, Abd Soft, Non tender, No organomegaly appriciated, No rebound - guarding or rigidity. No Cyanosis, Clubbing or edema, No new Rash or bruise    Data Review

## 2015-10-29 NOTE — Tx Team (Signed)
Initial Interdisciplinary Treatment Plan   PATIENT STRESSORS: Financial difficulties Health problems Occupational concerns   PATIENT STRENGTHS: Ability for insight Average or above average intelligence Capable of independent living Communication skills Financial means Supportive family/friends Work skills   PROBLEM LIST: Problem List/Patient Goals Date to be addressed Date deferred Reason deferred Estimated date of resolution  At risk for suicide 10/29/2015  10/29/2015   D/C  Depression 10/29/2015  10/29/2015   D/C  "Learning to manage stress" 10/29/2015  10/29/2015   D/C  "Be able to deal with my problems better" 10/29/2015  10/29/2015   D/C                                 DISCHARGE CRITERIA:  Ability to meet basic life and health needs Adequate post-discharge living arrangements Improved stabilization in mood, thinking, and/or behavior Medical problems require only outpatient monitoring Motivation to continue treatment in a less acute level of care Need for constant or close observation no longer present Reduction of life-threatening or endangering symptoms to within safe limits Verbal commitment to aftercare and medication compliance  PRELIMINARY DISCHARGE PLAN: Outpatient therapy Return to previous living arrangement Return to previous work or school arrangements  PATIENT/FAMIILY INVOLVEMENT: This treatment plan has been presented to and reviewed with the patient, Nicole Walters.  The patient and family have been given the opportunity to ask questions and make suggestions.  [name], Kalman Nylen P 10/29/2015, 3:48 PM

## 2015-10-29 NOTE — Progress Notes (Signed)
Recreation Therapy Notes  Animal-Assisted Activity (AAA) Program Checklist/Progress Notes Patient Eligibility Criteria Checklist & Daily Group note for Rec TxIntervention  Date: 07.18.2017 Time: 2:45pm Location: [address] Dayroom    AAA/T Program Assumption of Risk Form signed by Patient/ or Parent Legal Guardian Yes  Patient is free of allergies or sever asthma Yes  Patient reports no fear of animals Yes  Patient reports no history of cruelty to animals Yes  Patient understands his/her participation is voluntary Yes  Behavioral Response: Did not attend.   Yarelly L Harles Evetts, LRT/CTRS         Kendallyn Lippold, Shavona L 10/29/2015 3:15 PM 

## 2015-10-30 DIAGNOSIS — F332 Major depressive disorder, recurrent severe without psychotic features: Principal | ICD-10-CM

## 2015-10-30 MED ORDER — CYCLOBENZAPRINE HCL 5 MG PO TABS
5.0000 mg | ORAL_TABLET | Freq: Every day | ORAL | Status: DC
  Administered 2015-10-30 – 2015-11-01 (×3): 5 mg via ORAL
  Filled 2015-10-30 (×5): qty 1

## 2015-10-30 MED ORDER — MIRTAZAPINE 7.5 MG PO TABS
7.5000 mg | ORAL_TABLET | Freq: Every day | ORAL | Status: DC
  Administered 2015-10-30 – 2015-10-31 (×2): 7.5 mg via ORAL
  Filled 2015-10-30 (×4): qty 1

## 2015-10-30 MED ORDER — DULOXETINE HCL 30 MG PO CPEP
30.0000 mg | ORAL_CAPSULE | Freq: Every day | ORAL | Status: DC
  Administered 2015-10-31: 30 mg via ORAL
  Filled 2015-10-30 (×2): qty 1

## 2015-10-30 MED ORDER — PRAVASTATIN SODIUM 20 MG PO TABS
20.0000 mg | ORAL_TABLET | Freq: Every day | ORAL | Status: DC
  Administered 2015-10-30 – 2015-10-31 (×2): 20 mg via ORAL
  Filled 2015-10-30 (×4): qty 1

## 2015-10-30 MED ORDER — PANTOPRAZOLE SODIUM 40 MG PO TBEC
40.0000 mg | DELAYED_RELEASE_TABLET | Freq: Every day | ORAL | Status: DC
  Administered 2015-10-30 – 2015-11-01 (×3): 40 mg via ORAL
  Filled 2015-10-30 (×5): qty 1

## 2015-10-30 NOTE — Tx Team (Signed)
Interdisciplinary Treatment Plan Update (Adult) Date: 10/30/2015   Date: 10/30/2015 9:48 AM  Progress in Treatment:  Attending groups: Pt is new to milieu, continuing to assess  Participating in groups: Pt is new to milieu, continuing to assess  Taking medication as prescribed: Yes  Tolerating medication: Yes  Family/Significant othe contact made: No, CSW assessing for appropriate contact Patient understands diagnosis: Yes AEB seeking help with depression Discussing patient identified problems/goals with staff: Yes  Medical problems stabilized or resolved: Yes  Denies suicidal/homicidal ideation: Yes Patient has not harmed self or Others: Yes   New problem(s) identified: None identified at this time.   Discharge Plan or Barriers: Pt will return home and follow-up with outpatient services.   Additional comments:  Patient and CSW reviewed pt's identified goals and treatment plan. Patient verbalized understanding and agreed to treatment plan.   Reason for Continuation of Hospitalization:  Anxiety Depression Medication stabilization Suicidal ideation  Estimated length of stay: 2-3 days  Review of initial/current patient goals per problem list:   1.  Goal(s): Patient will participate in aftercare plan  Met:  Yes  Target date: 3-5 days from date of admission   As evidenced by: Patient will participate within aftercare plan AEB aftercare provider and housing plan at discharge being identified.   10/30/15: Pt will return home and follow-up with outpatient services.   2.  Goal (s): Patient will exhibit decreased depressive symptoms and suicidal ideations.  Met:  Yes  Target date: 3-5 days from date of admission   As evidenced by: Patient will utilize self rating of depression at 3 or below and demonstrate decreased signs of depression or be deemed stable for discharge by MD. 10/30/15: Pt rates depression at 0/10; denies SI  3.  Goal(s): Patient will demonstrate decreased signs  and symptoms of anxiety.  Met:  Yes  Target date: 3-5 days from date of admission   As evidenced by: Patient will utilize self rating of anxiety at 3 or below and demonstrated decreased signs of anxiety, or be deemed stable for discharge by MD  10/30/15: Pt rates anxiety at 0/10  Attendees:  Patient:    Family:    Physician: Dr. Parke Poisson, MD  10/30/2015 9:48 AM  Nursing: Darrol Angel, RN 10/30/2015 9:48 AM  Clinical Social Worker Peri Maris, [place] 10/30/2015 9:48 AM  Other: Tilden Fossa, [place] 10/30/2015 9:48 AM  Clinical: Lars Pinks, RN Case manager  10/30/2015 9:48 AM  Other:  10/30/2015 9:48 AM  Other:     Peri Maris, [place] Work [phone]

## 2015-10-30 NOTE — Progress Notes (Signed)
Adult Psychoeducational Group Note  Date:  10/30/2015 Time:  9:35 PM  Group Topic/Focus:  Wrap-Up Group:   The focus of this group is to help patients review their daily goal of treatment and discuss progress on daily workbooks.  Participation Level:  Active  Participation Quality:  Appropriate  Affect:  Appropriate  Cognitive:  Alert  Insight: Appropriate  Engagement in Group:  Engaged  Modes of Intervention:  Discussion  Additional Comments:  Pt rated her day 10/10. Her goal for tomorrow is to work on discharge planning.   Kaleen OdeaCOOKE, Rakiya Krawczyk R 10/30/2015, 9:35 PM

## 2015-10-30 NOTE — Progress Notes (Signed)
Recreation Therapy Notes  Date: 07.19.2017 Time: 9:30am Location: [address] Group Room   Group Topic: Stress Management  Goal Area(s) Addresses:  Patient will actively participate in stress management techniques presented during session.   Behavioral Response: Did not attend.   Keigan L Yessika Otte, LRT/CTRS        Harbor Vanover, Ratasha L 10/30/2015 3:21 PM 
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
## 2015-10-30 NOTE — Progress Notes (Signed)
D: Pt presents with flat affect. Pt appears to be minimizing her symptoms. Pt denies depression and anxiety. Pt denies suicidal thoughts. Pt requesting to be discharged home, stating that she's already been hospitalized for a week on the medical floor. Pt c/o hip pain and requested Motrin. Motrin given at pt request. Pt compliant with taking meds and attending groups.  A: Medications reviewed with pt. Medications administered as ordered per MD. Verbal support provided. Pt encouraged to attend groups. 15 minute checks performed for safety. R: Pt compliant with tx.

## 2015-10-30 NOTE — H&P (Signed)
Psychiatric Admission Assessment Adult  Patient Identification: Nicole Walters MRN:  [id number] Date of Evaluation:  10/30/2015 Chief Complaint:  mdd recurrent severe ptsd Principal Diagnosis: MDD (major depressive disorder), recurrent episode, severe ([place]) Diagnosis:   Patient Active Problem List   Diagnosis Date Noted  . MDD (major depressive disorder), recurrent episode, severe ([place]) [F33.2] 10/29/2015    Priority: High  . Respiratory depression [R06.89] 10/23/2015  . Drug overdose [T50.901A] 10/23/2015   History of Present Illness:  Nicole Walters, 45 yo, single, lives alon, no children, employed,  was initially seen at [facility] for ingestion of Vicodin, Lunesta and Ambien.  She was non specific with stress triggers rather she states her depression worsened over a period of time.  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  Additional;y, feeling that she is overworked and frustrated that co workers are not doing their joband she has to pick up the slack.  She also states that she had been working with her PCP to achieve the right medications to help her sleep.  She only averages about 3 hrs a night with sleep.  She denies bipolar mood symptoms nor psychosis.  She feels that her mood worsened due to more stress at work and she has PMDD.  She is on Prozac 40 mg (10 years now) and Wellbutrin 300 mg (6 years), she feels both meds may not be as effective as it once was.  Her PCP had been working with also various sleep aids and felt she needed more specialized care with this.  She has supportive parents, and states she is happy as a single woman.     Associated Signs/Symptoms: Depression Symptoms:  depressed mood, anxiety, (Hypo) Manic Symptoms:  Impulsivity, Irritable Mood, Labiality of Mood, Anxiety Symptoms:  Excessive Worry, Psychotic Symptoms:  NA PTSD Symptoms: NA Total Time spent with patient: 30  minutes  Past Psychiatric History: see HPI  Is the patient at risk to self? Yes.    Has the patient been a risk to self in the past 6 months? Yes.    Has the patient been a risk to self within the distant past? Yes.    Is the patient a risk to others? No.  Has the patient been a risk to others in the past 6 months? No.  Has the patient been a risk to others within the distant past? No.   Prior Inpatient Therapy:   Prior Outpatient Therapy:    Alcohol Screening: 1. How often do you have a drink containing alcohol?: Monthly or less 2. How many drinks containing alcohol do you have on a typical day when you are drinking?: 1 or 2 3. How often do you have six or more drinks on one occasion?: Never Preliminary Score: 0 9. Have you or someone else been injured as a result of your drinking?: No 10. Has a relative or friend or a doctor or another health worker been concerned about your drinking or suggested you cut down?: No Alcohol Use Disorder Identification Test Final Score (AUDIT): 1 Brief Intervention: AUDIT score less than 7 or less-screening does not suggest unhealthy drinking-brief intervention not indicated Substance Abuse History in the last 12 months:  Yes.   Consequences of Substance Abuse: Medical Consequences:  inpatient admission Previous Psychotropic Medications: Yes  Psychological Evaluations: Yes  Past Medical History:  Past Medical History  Diagnosis Date  . GERD (gastroesophageal reflux disease)   .  Hypercholesterolemia     Past Surgical History  Procedure Laterality Date  . Cholecystectomy    . Oophorectomy      unsure location   Family History: History reviewed. No pertinent family history. Family Psychiatric  History: see HPI  Tobacco Screening: @FLOW ([phone])::1)@ Social History:  History  Alcohol Use No     History  Drug Use No    Additional Social History: Marital status: Single Does patient have children?: No       Allergies:   Allergies   Allergen Reactions  . Levaquin [Levofloxacin] Other (See Comments)    Severe leg cramps   Lab Results: No results found for this or any previous visit (from the past 48 hour(s)).  Blood Alcohol level:  Lab Results  Component Value Date   ETH <5 16/01/9603    Metabolic Disorder Labs:  No results found for: HGBA1C, MPG No results found for: PROLACTIN No results found for: CHOL, TRIG, HDL, CHOLHDL, VLDL, LDLCALC  Current Medications: Current Facility-Administered Medications  Medication Dose Route Frequency Provider Last Rate Last Dose  . acetaminophen (TYLENOL) tablet 650 mg  650 mg Oral Q6H PRN Niel Hummer, NP   650 mg at 10/29/15 2150  . alum & mag hydroxide-simeth (MAALOX/MYLANTA) 200-200-20 MG/5ML suspension 30 mL  30 mL Oral Q4H PRN Niel Hummer, NP      . alum & mag hydroxide-simeth (MAALOX/MYLANTA) 200-200-20 MG/5ML suspension 30 mL  30 mL Oral Q4H PRN Niel Hummer, NP      . clonazePAM [facility] [place]) tablet 0.5 mg  0.5 mg Oral QHS Niel Hummer, NP   0.5 mg at 10/29/15 2147  . cyclobenzaprine (FLEXERIL) tablet 5 mg  5 mg Oral Daily Kerrie Buffalo, NP      . fluticasone [facility]) 50 MCG/ACT nasal spray 2 spray  2 spray Each Nare Daily Niel Hummer, NP   2 spray at 10/30/15 0820  . hydrALAZINE (APRESOLINE) tablet 50 mg  50 mg Oral Q8H Niel Hummer, NP   50 mg at 10/30/15 1404  . ibuprofen (ADVIL,MOTRIN) tablet 600 mg  600 mg Oral Q6H PRN Niel Hummer, NP   600 mg at 10/30/15 1404  . magnesium hydroxide (MILK OF MAGNESIA) suspension 30 mL  30 mL Oral Daily PRN Niel Hummer, NP      . ondansetron [facility]) injection 4 mg  4 mg Intravenous Q6H PRN Niel Hummer, NP      . pantoprazole (PROTONIX) EC tablet 40 mg  40 mg Oral Daily Kerrie Buffalo, NP      . pravastatin (PRAVACHOL) tablet 20 mg  20 mg Oral q1800 Kerrie Buffalo, NP      . SUMAtriptan (IMITREX) tablet 50 mg  50 mg Oral Q2H PRN Niel Hummer, NP       PTA Medications: Prescriptions prior to admission  Medication  Sig Dispense Refill Last Dose  . Calcium Carb-Cholecalciferol (CALCIUM 600 + D PO) Take 1 tablet by mouth daily.   10/23/2015 at Unknown time  . clonazePAM (KLONOPIN) 0.5 MG tablet Take 1 tablet (0.5 mg total) by mouth at bedtime. 30 tablet 0   . esomeprazole (NEXIUM) 40 MG capsule Take 40 mg by mouth daily at 12 noon.   Past Week at -  . Glucosamine-Chondroit-Vit C-Mn (GLUCOSAMINE-CHONDROITIN) CAPS Take 2 capsules by mouth daily. Glucosamine 1500 mg/ chondroiton 1200 mg   10/23/2015 at Unknown time  . hydrALAZINE (APRESOLINE) 50 MG tablet Take 1 tablet (50 mg total) by mouth every  8 (eight) hours.     Marland Kitchen levonorgestrel (MIRENA) 20 MCG/24HR IUD 1 each by Intrauterine route once. Implanted November or December 2013   unknown at Unknown time  . loratadine (CLARITIN) 10 MG tablet Take 10 mg by mouth daily.   10/23/2015 at Unknown time  . lovastatin (MEVACOR) 20 MG tablet Take 20 mg by mouth daily.    10/23/2015 at Unknown time  . Multiple Vitamin (MULTIVITAMIN WITH MINERALS) TABS tablet Take 1 tablet by mouth daily.   10/23/2015 at Unknown time  . rizatriptan (MAXALT) 10 MG tablet Take 10 mg by mouth as needed for migraine. May take 2nd tablet if needed - no more than 2 tablets in 24 hours   Past Week at Unknown time    Musculoskeletal: Strength & Muscle Tone: within normal limits Gait & Station: normal Patient leans: N/A  Psychiatric Specialty Exam: Physical Exam  Vitals reviewed.   ROS  Blood pressure 113/68, pulse 99, temperature 98.8 F (37.1 C), temperature source Oral, resp. rate 16, height 5' 7"  (1.702 m), weight 149.233 kg (329 lb), SpO2 100 %.Body mass index is 51.52 kg/(m^2).  General Appearance: Neat  Eye Contact:  Fair  Speech:  Clear and Coherent  Volume:  Normal  Mood:  Anxious  Affect:  Appropriate  Thought Process:  Coherent  Orientation:  Full (Time, Place, and Person)  Thought Content:  Rumination  Suicidal Thoughts:  No  Homicidal Thoughts:  No  Memory:  Immediate;    Good Recent;   Good Remote;   Good  Judgement:  Fair  Insight:  Fair  Psychomotor Activity:  Normal  Concentration:  Concentration: Good and Attention Span: Good  Recall:  Good  Fund of Knowledge:  Good  Language:  Good  Akathisia:  Negative  Handed:  Right  AIMS (if indicated):     Assets:  Communication Skills Resilience Social Support  ADL's:  Intact  Cognition:  WNL  Sleep:  Number of Hours: 6.5   Treatment Plan Summary: Admit for crisis management and mood stabilization. Medication management to re-stabilize current mood symptoms.  Cymbalta 30 mg depression. Remeron 7.5 mg insomnia Group counseling sessions for coping skills Medical consults as needed Review and reinstate any pertinent home medications for other health problems  Observation Level/Precautions:  15 minute checks  Laboratory:  per ED  Psychotherapy:  group  Medications:  As per medlist  Consultations:  As needed  Discharge Concerns:  safety  Estimated LOS:  2- 7 days  Other:     I certify that inpatient services furnished can reasonably be expected to improve the patient's condition.    [facility], NP [facility] 7/19/20173:04 PM I have discussed case with treatment team and have met with patient  Agree with NP note and assessment  45 year old female. Single, no children, lives alone , employed full time, works third shift . 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 Another stressor was related to the death of a coworker's GF .  Reports long history of insomnia as well . States she had been depressed , anxious prior to admission.  States that one week ago she impulsively overdosed on Ambien, Vicodin, Lunesta. States " I am not really sure why I did it, it was impulsive, spur of the moment, I called my parents afterwards and they called 911"  She required medical  admission  initially for medical stabilization /observation. States she has a history of depression and reports " I have been on the same medications for 10 years, I wonder whether this is still a good regimen for me".  She had been on Fluoxetine 40 mgrs QDAY and Buproprion 300 mgrs QDAY for several years- denies medication side effects. States she has never been admitted to a psychiatric unit before, no prior suicide attempts, no history of psychosis, denies history of mania. Describes frequent anxiety, panic attacks at times triggered by unfamiliar situations  Denies alcohol or drug abuse  Denies medical illnesses.  Dx- Depression, Suicide Attempt  Plan - inpatient admission - we discussed options, she prefers to start new antidepressant trial rather than go back on Prozac/Wellbutrin . Agrees to Cymbalta , will start 30 mgrs QDAY . Agrees to Remeron 7.5 mgrs QHS for insomnia and as antidepressant adjunct .

## 2015-10-30 NOTE — Progress Notes (Signed)
Patient ID: Lacy DuverneyDenise Walters, female   DOB: Aug 15, 1970, 45 y.o.   MRN: [id number] D: client visible on the unit, seen dayroom interacting with peers client reports goal "get better" notes she was admitted because "I tried to kill myself" "well I have PMDD and with that and work related stress" "taling and being in group have helped a lot with the drug regimen" A: Writer provided emotional support, encouraged client to report any concerns. Medications reviewed and administered as ordered. Staff will monitor q6915min for safety. R: Client is safe on the unit, attended group.
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
## 2015-10-31 MED ORDER — DULOXETINE HCL 20 MG PO CPEP
40.0000 mg | ORAL_CAPSULE | Freq: Every day | ORAL | Status: DC
  Administered 2015-11-01: 40 mg via ORAL
  Filled 2015-10-31 (×3): qty 2

## 2015-10-31 NOTE — Progress Notes (Signed)
[facility] MD Progress Note  10/31/2015 4:50 PM Nicole Walters  MRN:  [id number] Subjective: Patient states she is feeling better , less depressed and states her family has been very supportive, which is helping her feel better as well. She is hoping to be discharged soon, and states " I am really feeling better, ready to go ". States she is planning on going to live with her parents for a period of time after discharge. At this time denies medication side effects. Objective : I have discussed case with treatment team and have met with patient . She presents with improving mood and range of affect,and today minimizes any severe neuro-vegetative symptoms of depression, except for insomnia, which she states is due to finding hospital bed uncomfortable. States " I sleep so much better in my own bed ". Denies medication side effects. Tolerating Cymbalta well thus far . No disruptive or agitated behaviors on unit . She has been going to groups, and is visible in day room, and has been scoring depressive symptoms low .  Principal Problem: MDD (major depressive disorder), recurrent episode, severe ([place]) Diagnosis:   Patient Active Problem List   Diagnosis Date Noted  . MDD (major depressive disorder), recurrent episode, severe ([place]) [F33.2] 10/29/2015  . Respiratory depression [R06.89] 10/23/2015  . Drug overdose [T50.901A] 10/23/2015   Total Time spent with patient: 20 minutes    Past Medical History:  Past Medical History  Diagnosis Date  . GERD (gastroesophageal reflux disease)   . Hypercholesterolemia     Past Surgical History  Procedure Laterality Date  . Cholecystectomy    . Oophorectomy      unsure location   Family History: History reviewed. No pertinent family history.  Social History:  History  Alcohol Use No     History  Drug Use No    Social History   Social History  . Marital Status: Single    Spouse Name: N/A  . Number of Children: N/A  . Years of Education: N/A    Social History Main Topics  . Smoking status: Never Smoker   . Smokeless tobacco: Never Used  . Alcohol Use: No  . Drug Use: No  . Sexual Activity: No   Other Topics Concern  . None   Social History Narrative   Additional Social History:   Sleep: Fair  Appetite:  Good   Current Medications: Current Facility-Administered Medications  Medication Dose Route Frequency Provider Last Rate Last Dose  . acetaminophen (TYLENOL) tablet 650 mg  650 mg Oral Q6H PRN Niel Hummer, NP   650 mg at 10/29/15 2150  . alum & mag hydroxide-simeth (MAALOX/MYLANTA) 200-200-20 MG/5ML suspension 30 mL  30 mL Oral Q4H PRN Niel Hummer, NP      . alum & mag hydroxide-simeth (MAALOX/MYLANTA) 200-200-20 MG/5ML suspension 30 mL  30 mL Oral Q4H PRN Niel Hummer, NP      . cyclobenzaprine (FLEXERIL) tablet 5 mg  5 mg Oral Daily Kerrie Buffalo, NP   5 mg at 10/31/15 0806  . [START ON 11/01/2015] DULoxetine (CYMBALTA) DR capsule 40 mg  40 mg Oral Daily Myer Peer Mikell Camp, MD      . fluticasone (FLONASE) 50 MCG/ACT nasal spray 2 spray  2 spray Each Nare Daily Niel Hummer, NP   2 spray at 10/31/15 [phone]  . hydrALAZINE (APRESOLINE) tablet 50 mg  50 mg Oral Q8H Niel Hummer, NP   50 mg at 10/31/15 0618  . ibuprofen (ADVIL,MOTRIN) tablet 600  mg  600 mg Oral Q6H PRN Niel Hummer, NP   600 mg at 10/30/15 1404  . magnesium hydroxide (MILK OF MAGNESIA) suspension 30 mL  30 mL Oral Daily PRN Niel Hummer, NP      . mirtazapine (REMERON) tablet 7.5 mg  7.5 mg Oral QHS Myer Peer Haddon Fyfe, MD   7.5 mg at 10/30/15 2146  . ondansetron (ZOFRAN) injection 4 mg  4 mg Intravenous Q6H PRN Niel Hummer, NP      . pantoprazole (PROTONIX) EC tablet 40 mg  40 mg Oral Daily Kerrie Buffalo, NP   40 mg at 10/31/15 0806  . pravastatin (PRAVACHOL) tablet 20 mg  20 mg Oral q1800 Kerrie Buffalo, NP   20 mg at 10/30/15 1733  . SUMAtriptan (IMITREX) tablet 50 mg  50 mg Oral Q2H PRN Niel Hummer, NP        Lab Results: No results found  for this or any previous visit (from the past 9 hour(s)).  Blood Alcohol level:  Lab Results  Component Value Date   ETH <5 99/83/3825    Metabolic Disorder Labs: No results found for: HGBA1C, MPG No results found for: PROLACTIN No results found for: CHOL, TRIG, HDL, CHOLHDL, VLDL, LDLCALC  Physical Findings: AIMS: Facial and Oral Movements Muscles of Facial Expression: None, normal Lips and Perioral Area: None, normal Jaw: None, normal Tongue: None, normal,Extremity Movements Upper (arms, wrists, hands, fingers): None, normal Lower (legs, knees, ankles, toes): None, normal, Trunk Movements Neck, shoulders, hips: None, normal, Overall Severity Severity of abnormal movements (highest score from questions above): None, normal Incapacitation due to abnormal movements: None, normal Patient's awareness of abnormal movements (rate only patient's report): No Awareness, Dental Status Current problems with teeth and/or dentures?: No Does patient usually wear dentures?: No  CIWA:    COWS:     Musculoskeletal: Strength & Muscle Tone: within normal limits Gait & Station: normal Patient leans: N/A  Psychiatric Specialty Exam: Physical Exam  ROS denies headache, no chest pain, no shortness of breath, no vomiting   Blood pressure 133/88, pulse 110, temperature 98 F (36.7 C), temperature source Oral, resp. rate 18, height 5' 7"  (1.702 m), weight 329 lb (149.233 kg), SpO2 100 %.Body mass index is 51.52 kg/(m^2).  General Appearance: improved grooming   Eye Contact:  Good  Speech:  Normal Rate  Volume:  Normal  Mood:  reports her mood is improved, minimizes depression at this time  Affect:  more reactive  Thought Process:  Linear  Orientation:  Full (Time, Place, and Person)  Thought Content:  denies hallucinations, no delusions , not internally preoccupied   Suicidal Thoughts:  No denies any suicidal or self injurious ideations, and denies any homicidal or violent ideations    Homicidal Thoughts:  No  Memory:  recent and remote grossly intact   Judgement: improving   Insight:  Fair  Psychomotor Activity:  Normal  Concentration:  Concentration: Good and Attention Span: Good  Recall:  Good  Fund of Knowledge:  Good  Language:  Good  Akathisia:  Negative  Handed:  Right  AIMS (if indicated):     Assets:  Desire for Improvement Resilience  ADL's:  Intact  Cognition:  WNL  Sleep:  Number of Hours: 5.5   Assessment- patient presents with improving mood and range of affect and today minimizes depression and denies any suicidal ideations . At this time tolerating Cymbalta trial well, without side effects. Describes parents as supportive and states they  are helping her feel better with their concern and support for her. At this time planning to go live with them for a period of time after discharge.    Treatment Plan Summary: Daily contact with patient to assess and evaluate symptoms and progress in treatment, Medication management, Plan inpatient treatment  and medications as below  Encourage ongoing groups and milieu participation to work on coping skills and symptom reduction  Increase Cymbalta to 40 mgrs QDAY for depression, anxiety. Continue Remeron 7.5 mgrs QHS for insomnia and depression Treatment team working on disposition planning   Neita Garnet, MD 10/31/2015, 4:50 PM

## 2015-10-31 NOTE — Progress Notes (Signed)
Adult Psychoeducational Group Note  Date:  10/31/2015 Time:  11:19 AM  Group Topic/Focus:  Goals Group:   The focus of this group is to help patients establish daily goals to achieve during treatment and discuss how the patient can incorporate goal setting into their daily lives to aide in recovery.  Participation Level:  Active  Participation Quality:  Appropriate  Affect:  Appropriate  Cognitive:  Appropriate  Insight: Appropriate  Engagement in Group:  Engaged  Modes of Intervention:  Discussion  Additional Comments:  Pt stated she is doing good  Pt stated lifestyle and leisure changes she want to make when she leaves is to get out of the house more and do things with her family.  Wynema BirchCagle, Yamileth Hayse D 10/31/2015, 11:19 AM

## 2015-10-31 NOTE — [facility] Group Notes (Signed)
[facility] Mental [facility] Group Therapy 10/31/2015 1:15pm  Type of Therapy: Mental Health Association Presentation  Participation Level: Active  Participation Quality: Attentive  Affect: Appropriate  Cognitive: Oriented  Insight: Developing/Improving  Engagement in Therapy: Engaged  Modes of Intervention: Discussion, Education and Socialization  Summary of Progress/Problems: Mental Health Association (MHA) Speaker came to talk about his personal journey with substance abuse and addiction. The pt processed ways by which to relate to the speaker. MHA speaker provided handouts and educational information pertaining to groups and services offered by the MHA. Pt was engaged in speaker's presentation and was receptive to resources provided.    Mirtie Bastyr Carter, LCSWA 10/31/2015 2:17 PM  

## 2015-10-31 NOTE — Progress Notes (Signed)
Patient ID: Nicole DuverneyDenise Walters, female   DOB: [birth year], 45 y.o.   MRN: [id number] Pt currently presents with a flat affect and cooperative behavior. Per self inventory, pt rates depression, hopelessness and anxiety at a 0. Pt's daily goal is to "learning coping skills, hopefully being able to go home" and they intend to do so by "going to group meetings." Pt reports fair sleep, a good appetite, normal energy and good concentration.   Pt provided with medications per providers orders. Pt's labs and vitals were monitored throughout the day. Pt supported emotionally and encouraged to express concerns and questions. Pt educated on medications and depression.   Pt's safety ensured with 15 minute and environmental checks. Pt currently denies SI/HI and A/V hallucinations. Pt verbally agrees to seek staff if SI/HI or A/VH occurs and to consult with staff before acting on any harmful thoughts. Pt interacts positively with peers in the milieu. Able to identify listening to music and talking with others as coping skills. Will continue POC.

## 2015-10-31 NOTE — [facility] Suicide Risk Assessment (Signed)
[facility] INPATIENT:  Family/Significant Other Suicide Prevention Education  Suicide Prevention Education:  Education Completed; Nicole Walters, Pt's father [phone], has been identified by the patient as the family member/significant other with whom the patient will be residing, and identified as the person(s) who will aid the patient in the event of a mental health crisis (suicidal ideations/suicide attempt).  With written consent from the patient, the family member/significant other has been provided the following suicide prevention education, prior to the and/or following the discharge of the patient.  The suicide prevention education provided includes the following:  Suicide risk factors  Suicide prevention and interventions  National Suicide Hotline telephone number  [facility] assessment telephone number  [facility] Emergency Assistance 911  [facility] and/or Residential Mobile Crisis Unit telephone number  Request made of family/significant other to:  Remove weapons (e.g., guns, rifles, knives), all items previously/currently identified as safety concern.    Remove drugs/medications (over-the-counter, prescriptions, illicit drugs), all items previously/currently identified as a safety concern.  The family member/significant other verbalizes understanding of the suicide prevention education information provided.  The family member/significant other agrees to remove the items of safety concern listed above.  Nicole Walters, Nicole Walters 10/31/2015, 3:24 PM

## 2015-10-31 NOTE — Progress Notes (Signed)
Pt attended karaoke group this evening.  

## 2015-10-31 NOTE — [facility] Group Notes (Signed)
[facility] LCSW Group Therapy 10/30/2015 1:15 PM  Type of Therapy: Group Therapy- Emotion Regulation  Participation Level: Active   Participation Quality:  Appropriate  Affect: Appropriate  Cognitive: Alert and Oriented   Insight:  Developing/Improving  Engagement in Therapy: Developing/Improving and Engaged   Modes of Intervention: Clarification, Confrontation, Discussion, Education, Exploration, Limit-setting, Orientation, Problem-solving, Rapport Building, Dance movement psychotherapisteality Testing, Socialization and Support  Summary of Progress/Problems: The topic for group today was emotional regulation. This group focused on both positive and negative emotion identification and allowed group members to process ways to identify feelings, regulate negative emotions, and find healthy ways to manage internal/external emotions. Group members were asked to reflect on a time when their reaction to an emotion led to a negative outcome and explored how alternative responses using emotion regulation would have benefited them. Group members were also asked to discuss a time when emotion regulation was utilized when a negative emotion was experienced. Pt discussed feeling stressed at work and how this leads to irritability with coworkers and other support people in her life. She was able to articulate triggers for her increased stress and identified needing to use support and self-care activities in order to manage the stress more effectively.   Nicole CordialLauren Carter, LCSWA

## 2015-10-31 NOTE — Progress Notes (Signed)
Nutrition Education Note  Pt attended group focusing on general, healthful nutrition education.  RD emphasized the importance of eating regular meals and snacks throughout the day. Consuming sugar-free beverages and incorporating fruits and vegetables into diet when possible. Provided examples of healthy snacks. Patient encouraged to leave group with a goal to improve nutrition/healthy eating.   Diet Order: Diet Heart Room service appropriate? Yes; Fluid consistency: Thin Pt is also offered choice of unit snacks mid-morning and mid-afternoon.  Pt is eating as desired.   If additional nutrition issues arise, please consult RD.    Giang Hemme, MS, RD, LDN Inpatient Clinical Dietitian Pager # [phone] After hours/weekend pager # [phone]     

## 2015-11-01 DIAGNOSIS — F332 Major depressive disorder, recurrent severe without psychotic features: Secondary | ICD-10-CM | POA: Insufficient documentation

## 2015-11-01 MED ORDER — RIZATRIPTAN BENZOATE 10 MG PO TABS: 10.0000 mg | ORAL_TABLET | ORAL | Status: AC | PRN

## 2015-11-01 MED ORDER — LOVASTATIN 20 MG PO TABS: 20.0000 mg | ORAL_TABLET | Freq: Every day | ORAL | Status: AC

## 2015-11-01 MED ORDER — DULOXETINE HCL 40 MG PO CPEP: 40.0000 mg | ORAL_CAPSULE | Freq: Every day | ORAL | Status: AC

## 2015-11-01 MED ORDER — MIRTAZAPINE 7.5 MG PO TABS: 7.5000 mg | ORAL_TABLET | Freq: Every day | ORAL | Status: AC

## 2015-11-01 MED ORDER — CYCLOBENZAPRINE HCL 5 MG PO TABS: 5.0000 mg | ORAL_TABLET | Freq: Every day | ORAL | Status: AC

## 2015-11-01 MED ORDER — HYDRALAZINE HCL 50 MG PO TABS: 50.0000 mg | ORAL_TABLET | Freq: Three times a day (TID) | ORAL | Status: AC

## 2015-11-01 MED ORDER — PANTOPRAZOLE SODIUM 40 MG PO TBEC: 40.0000 mg | DELAYED_RELEASE_TABLET | Freq: Every day | ORAL | Status: AC

## 2015-11-01 MED ORDER — FLUTICASONE PROPIONATE 50 MCG/ACT NA SUSP
2.0000 | Freq: Every day | NASAL | Status: AC
Start: 2015-11-01 — End: ?

## 2015-11-01 NOTE — Progress Notes (Addendum)
Nicole Walters is readied for discharge home as she completes her daily assessment this AM and on it she writes she deneis experiencing SI today and she rates her depression, hopelessness and anviety. She is given her DC instructions ( AVS, SRA and transition form) as well as a cc of the suicide safety plan she created. She states she understands all info and that she is reeady. All belongings in her locker previously locked up are returned to her and hse is escorted out of bldg.

## 2015-11-01 NOTE — [facility] Suicide Risk Assessment (Signed)
[facility] Discharge Suicide Risk Assessment   Principal Problem: MDD (major depressive disorder), recurrent episode, severe (HCC) Discharge Diagnoses:  Patient Active Problem List   Diagnosis Date Noted  . MDD (major depressive disorder), recurrent episode, severe (HCC) [F33.2] 10/29/2015  . Respiratory depression [R06.89] 10/23/2015  . Drug overdose [T50.901A] 10/23/2015    Total Time spent with patient: 30 minutes  Musculoskeletal: Strength & Muscle Tone: within normal limits Gait & Station: normal Patient leans: N/A  Psychiatric Specialty Exam: ROS denies headache, no chest pain, no shortness of breath, no vomiting, no rash   Blood pressure 145/90, pulse 102, temperature 97.6 F (36.4 C), temperature source Oral, resp. rate 18, height  (1.702 m), weight 329 lb (149.233 kg), SpO2 100 %.Body mass index is 51.52 kg/(m^2).  General Appearance: improved grooming   Eye Contact::  Good  Speech:  Normal Rate409  Volume:  Normal  Mood:  improving mood, and currently presents euthymic  Affect:  Appropriate  Thought Process:  Linear  Orientation:  Full (Time, Place, and Person)  Thought Content:  denies hallucinations, no delusions, not internally preoccupied   Suicidal Thoughts:  No denies any self injurious or suicidal ideations  Homicidal Thoughts:  No denies homicidal or violent ideations   Memory:  recent and remote grossly intact   Judgement:  Other:  improved   Insight:  improved   Psychomotor Activity:  Normal  Concentration:  Good  Recall:  Good  Fund of Knowledge:Good  Language: Good  Akathisia:  Negative  Handed:  Right  AIMS (if indicated):     Assets:  Communication Skills Desire for Improvement Resilience  Sleep:  Number of Hours: 5.5  Cognition: WNL  ADL's:  Intact   Mental Status Per Nursing Assessment::   On Admission:  Suicidal ideation indicated by patient, Suicide plan, Plan includes specific time, place, or method, Self-harm thoughts, Self-harm  behaviors, Intention to act on suicide plan  Demographic Factors:  45 year old single female, no children, plans to go live with her parents for a period of time after discharge , employed   Loss Factors: Work related stress, recent death of a coworker's spouse   Historical Factors: History of depression, no history of suicide attempts, no history of self cutting   Risk Reduction Factors:   Sense of responsibility to family, Employed, Living with another person, especially a relative and Positive coping skills or problem solving skills  Continued Clinical Symptoms:  At this time patient is improved compared to admission- presents alert, attentive, with improved mood and states "I have been feeling better than I had been in a long time", no thought disorder, no SI or HI, no psychotic symptoms. At this time denies side effects from medications  Cognitive Features That Contribute To Risk:  No gross cognitive deficits noted upon discharge. Is alert , attentive, and oriented x 3   Suicide Risk:  Mild:  Suicidal ideation of limited frequency, intensity, duration, and specificity.  There are no identifiable plans, no associated intent, mild dysphoria and related symptoms, good self-control (both objective and subjective assessment), few other risk factors, and identifiable protective factors, including available and accessible social support.  Follow-up Information    Follow up with [facility].   Why:  8/30 at 2:00pm for medication management with Milana Obey. This was the first available appointment for a new patient. 8/1 at 2:00pm for therapy with [facility] information:   [address] [place] PHONE: [phone]  FAX: [phone]      Plan Of Care/Follow-up recommendations:  Activity:  as tolerated  Diet:  Regular  Tests:  NA Other:  See below  Patient is leaving unit in good spirits  States parents are coming to pick her up, and intends  to live with them for the near future . Patient has a PCP, [facility] , for medical issues as needed  Nehemiah MassedOBOS, Citlaly Camplin, MD 11/01/2015, 11:47 AM

## 2015-11-01 NOTE — Progress Notes (Signed)
  [facility]charge [facility] :  Will you be returning to the same living situation after discharge:  Yes,  Pt returning home, but will stay with her parents for a week prior to returning home At discharge, do you have transportation home?: Yes,  Parents to pick up Do you have the ability to pay for your medications: Yes,  Pt provided with prescriptions  Release of information consent forms completed and in the chart;  Patient's signature needed at discharge.  Patient to Follow up at: Follow-up Information    Follow up with [facility].   Why:  8/30 at 2:00pm for medication management with Milana Obeyeresa Francis. This was the first available appointment for a new patient. 8/1 at 2:00pm for therapy with [facility]   Contact information:   [address] [place] PHONE: [phone] FAX: [phone]      [facility] On 11/04/2015.   Why:  at 8:45am for medication management with Sander Radonandace Smith   Contact information:   [address] [place] [address] Call: [phone] Fax: [phone]      Next level of care provider has access to [facility] Link:no  Safety Planning and Suicide Prevention discussed: Yes,  with father; see SPE note  Have you used any form of tobacco in the last 30 days? (Cigarettes, Smokeless Tobacco, Cigars, and/or Pipes): No  Has patient been referred to the Quitline?: N/A patient is not a smoker  Patient has been referred for addiction treatment: N/A  Elaina HoopsCarter, Jovaughn Wojtaszek M 11/01/2015, 11:48 AM

## 2015-11-01 NOTE — Progress Notes (Signed)
[name]. Nicole Walters had been up and visible in milieu this evening, attended group activity. Nicole Walters was seen interacting appropriately with peers, she reported this evening that she is feeling better and is hopeful to be discharged sometime soon. Nicole Walters received all bedtime medications without incident and did complain of headache and requested and received some imitrex to address pain. A. Support and encouragement provided. R. Safety maintained, will continue to monitor.

## 2015-11-01 NOTE — Discharge Summary (Signed)
Physician Discharge Summary Note  Patient:  Nicole Walters is an 45 y.o., female MRN:  [id number] DOB:  04/29/1970 Patient phone:  [phone] (home)  Patient address:   [address] [place] [address],  Total Time spent with patient: Greater than 30 minutes.  Date of Admission:  10/29/2015 Date of Discharge: 11-01-15  Reason for Admission: Worsening symptoms of depression.   Principal Problem: MDD (major depressive disorder), recurrent episode, severe [facility])  Discharge Diagnoses: Patient Active Problem List   Diagnosis Date Noted  . MDD (major depressive disorder), recurrent episode, severe (HCC) [F33.2] 10/29/2015  . Respiratory depression [R06.89] 10/23/2015  . Drug overdose [T50.901A] 10/23/2015   Past Psychiatric History: Major depression  Past Medical History:  Past Medical History  Diagnosis Date  . GERD (gastroesophageal reflux disease)   . Hypercholesterolemia     Past Surgical History  Procedure Laterality Date  . Cholecystectomy    . Oophorectomy      unsure location   Family History: History reviewed. No pertinent family history.  Family Psychiatric  History: See H&P  Social History:  History  Alcohol Use No     History  Drug Use No    Social History   Social History  . Marital Status: Single    Spouse Name: N/A  . Number of Children: N/A  . Years of Education: N/A   Social History Main Topics  . Smoking status: Never Smoker   . Smokeless tobacco: Never Used  . Alcohol Use: No  . Drug Use: No  . Sexual Activity: No   Other Topics Concern  . None   Social History Narrative   Hospital Course: Nicole Walters, 45 yo, single, lives alone, no children, employed, was initially seen at [facility] for ingestion of Vicodin, Lunesta and Ambien. She was non specific with stress triggers rather she states her depression worsened over a period of time. 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 Additional;y, feeling that she is overworked and frustrated that co workers are not doing their job and she has to pick up the slack. She also states that she had been working with her PCP to achieve the right medications to help her sleep. She only averages about 3 hrs a night with sleep.  Nicole Walters was admitted to the hospital for worsening symptoms of depression which she attributed to work related stressors. She stated that her depression has worsened over time. She was in need of mood stabilization. After her admission assessment, Nicole Walters presenting symptoms were identified. The medication regimen targeting those symptoms were initiated. She was medicated & discharged on; Duloxetine 40 mg for depression, gabapentin 100 mg for agitation/pain management, Mirtazapine 7.5 mg for insomnia. Her other pre-existing medical problems were identified & treated by resuming her pertinent home medications for those health issues. She was enrolled & participated in the group counseling sessions being offered & held on this unit. She learned coping skills..  During the course of her hospitalization, Nicole Walters's improvement was monitored by observation & her daily report of symptom reduction noted.  Her emotional and mental status was monitored by daily self-inventory reports completed by her & the clinical staff. She was evaluated by the treatment team for stability and plans for continued recovery after discharge. Nicole Walters's motivation was an integral factor in her mood stability. She was offered further treatment options upon discharge on an outpatient basis. Upon discharge, Nicole Walters was  both mentally and medically stable. She denies suicidal/homicidal ideations, auditory/visual/tactile hallucinations, delusional thoughts or paranoia. She left [facility] with all belongings in no distress. Transportation per parents.  Physical Findings: AIMS: Facial and Oral Movements Muscles of Facial Expression:  None, normal Lips and Perioral Area: None, normal Jaw: None, normal Tongue: None, normal,Extremity Movements Upper (arms, wrists, hands, fingers): None, normal Lower (legs, knees, ankles, toes): None, normal, Trunk Movements Neck, shoulders, hips: None, normal, Overall Severity Severity of abnormal movements (highest score from questions above): None, normal Incapacitation due to abnormal movements: None, normal Patient's awareness of abnormal movements (rate only patient's report): No Awareness, Dental Status Current problems with teeth and/or dentures?: No Does patient usually wear dentures?: No  CIWA:    COWS:     Musculoskeletal: Strength & Muscle Tone: within normal limits Gait & Station: normal Patient leans: N/A  Psychiatric Specialty Exam: Physical Exam  Constitutional: She appears well-developed.  HENT:  Head: Normocephalic.  Eyes: Pupils are equal, round, and reactive to light.  Neck: Normal range of motion.  Cardiovascular: Normal rate.   Respiratory: Effort normal.  Genitourinary:  Denies any issues in this area  Musculoskeletal: Normal range of motion.  Neurological: She is alert.  Skin: Skin is warm and dry.    Review of Systems  Constitutional: Negative.   HENT: Negative.   Eyes: Negative.   Cardiovascular: Negative.   Gastrointestinal: Negative.   Genitourinary: Negative.   Skin: Negative.   Neurological: Negative.   Endo/Heme/Allergies: Negative.   Psychiatric/Behavioral: Negative for suicidal ideas, hallucinations, memory loss and substance abuse. Depression: Stable. The patient has insomnia (Stable). The patient is not nervous/anxious.     Blood pressure 145/90, pulse 102, temperature 97.6 F (36.4 C), temperature source Oral, resp. rate 18, height 5\' 7"  (1.702 m), weight 149.233 kg (329 lb), SpO2 100 %.Body mass index is 51.52 kg/(m^2).  See Md's SRA  Have you used any form of tobacco in the last 30 days? (Cigarettes, Smokeless Tobacco, Cigars,  and/or Pipes): No  Has this patient used any form of tobacco in the last 30 days? (Cigarettes, Smokeless Tobacco, Cigars, and/or Pipes):  No  Blood Alcohol level:  Lab Results  Component Value Date   ETH <5 10/23/2015   Metabolic Disorder Labs:  No results found for: HGBA1C, MPG No results found for: PROLACTIN No results found for: CHOL, TRIG, HDL, CHOLHDL, VLDL, LDLCALC  See Psychiatric Specialty Exam and Suicide Risk Assessment completed by Attending Physician prior to discharge.  Discharge destination:  Home  Is patient on multiple antipsychotic therapies at discharge:  No   Has Patient had three or more failed trials of antipsychotic monotherapy by history:  No  Recommended Plan for Multiple Antipsychotic Therapies: NA    Medication List    STOP taking these medications        CALCIUM 600 + D PO     clonazePAM 0.5 MG tablet  Commonly known as:  KLONOPIN     esomeprazole 40 MG capsule  Commonly known as:  NEXIUM     Glucosamine-Chondroitin Caps     levonorgestrel 20 MCG/24HR IUD  Commonly known as:  MIRENA     loratadine 10 MG tablet  Commonly known as:  CLARITIN     multivitamin with minerals Tabs tablet      TAKE these medications      Indication   cyclobenzaprine 5 MG tablet  Commonly known as:  FLEXERIL  Take 1 tablet (5 mg total) by mouth daily. For muscle spasms  Indication:  Muscle Spasm     DULoxetine HCl 40 MG Cpep  Take 40 mg by mouth daily. For depression   Indication:  Major Depressive Disorder     fluticasone 50 MCG/ACT nasal spray  Commonly known as:  FLONASE  Place 2 sprays into both nostrils daily. For allergies   Indication:  Allergic Rhinitis     hydrALAZINE 50 MG tablet  Commonly known as:  APRESOLINE  Take 1 tablet (50 mg total) by mouth every 8 (eight) hours. For high blood pressure   Indication:  High Blood Pressure     lovastatin 20 MG tablet  Commonly known as:  MEVACOR  Take 1 tablet (20 mg total) by mouth daily. For  acid reflux   Indication:  Inherited Heterozygous Hypercholesterolemia, Type II A Hyperlipidemia     mirtazapine 7.5 MG tablet  Commonly known as:  REMERON  Take 1 tablet (7.5 mg total) by mouth at bedtime. For sleep   Indication:  Trouble Sleeping     pantoprazole 40 MG tablet  Commonly known as:  PROTONIX  Take 1 tablet (40 mg total) by mouth daily. For acid reflux   Indication:  Gastroesophageal Reflux Disease     rizatriptan 10 MG tablet  Commonly known as:  MAXALT  Take 1 tablet (10 mg total) by mouth as needed for migraine. May take 2nd tablet if needed - no more than 2 tablets in 24 hours: For Migraine headaches   Indication:  Migraine Headache       Follow-up Information    Follow up with [facility].   Why:  8/30 at 2:00pm for medication management with Milana Obeyeresa Francis. This was the first available appointment for a new patient. 8/1 at 2:00pm [facility]   Contact information:   [address] [place] PHONE: [phone] FAX: [phone]     Follow-up recommendations: Activity:  As tolerated Diet: As recommended by your primary care doctor. Keep all scheduled follow-up appointments as recommended.   Comments: Patient is instructed prior to discharge to: Take all medications as prescribed by his/her mental healthcare provider. Report any adverse effects and or reactions from the medicines to his/her outpatient provider promptly. Patient has been instructed & cautioned: To not engage in alcohol and or illegal drug use while on prescription medicines. In the event of worsening symptoms, patient is instructed to call the crisis hotline, 911 and or go to the nearest ED for appropriate evaluation and treatment of symptoms. To follow-up with his/her primary care provider for your other medical issues, concerns and or health care needs.  Signed: Sanjuana KavaNwoko, Agnes I, NP, PMHNP, FNP-BC 11/01/2015, 11:31 AM  Patient seen, Suicide Assessment  Completed.  Disposition Plan Reviewed

## 2015-11-01 NOTE — Tx Team (Signed)
Interdisciplinary Treatment Plan Update (Adult) Date: 11/01/2015   Date: 11/01/2015 11:42 AM  Progress in Treatment:  Attending groups: Yes Participating in groups: Yes  Taking medication as prescribed: Yes  Tolerating medication: Yes  Family/Significant othe contact made: Yes with parents Patient understands diagnosis: Yes AEB seeking help with depression Discussing patient identified problems/goals with staff: Yes  Medical problems stabilized or resolved: Yes  Denies suicidal/homicidal ideation: Yes Patient has not harmed self or Others: Yes   New problem(s) identified: None identified at this time.   Discharge Plan or Barriers: Pt will return home and follow-up with outpatient services.   Additional comments:  Patient and CSW reviewed pt's identified goals and treatment plan. Patient verbalized understanding and agreed to treatment plan.   Reason for Continuation of Hospitalization:  Anxiety Depression Medication stabilization Suicidal ideation  Estimated length of stay: 0 days  Review of initial/current patient goals per problem list:   1.  Goal(s): Patient will participate in aftercare plan  Met:  Yes  Target date: 3-5 days from date of admission   As evidenced by: Patient will participate within aftercare plan AEB aftercare provider and housing plan at discharge being identified.   10/30/15: Pt will return home and follow-up with outpatient services.   2.  Goal (s): Patient will exhibit decreased depressive symptoms and suicidal ideations.  Met:  Yes  Target date: 3-5 days from date of admission   As evidenced by: Patient will utilize self rating of depression at 3 or below and demonstrate decreased signs of depression or be deemed stable for discharge by MD. 10/30/15: Pt rates depression at 0/10; denies SI  3.  Goal(s): Patient will demonstrate decreased signs and symptoms of anxiety.  Met:  Yes  Target date: 3-5 days from date of admission   As evidenced  by: Patient will utilize self rating of anxiety at 3 or below and demonstrated decreased signs of anxiety, or be deemed stable for discharge by MD  10/30/15: Pt rates anxiety at 0/10  Attendees:  Patient:    Family:    Physician: Dr. Parke Poisson, MD  11/01/2015 11:42 AM  Nursing: Sandre Kitty, RN; Jolly Mango, RN  11/01/2015 11:42 AM  Clinical Social Worker Peri Maris, [place] 11/01/2015 11:42 AM  Other:  11/01/2015 11:42 AM  Clinical: Lars Pinks, RN Case manager  11/01/2015 11:42 AM  Other:  11/01/2015 11:42 AM  Other:     Peri Maris, [place] [employer] [phone]

## 2017-03-24 DIAGNOSIS — K219 Gastro-esophageal reflux disease without esophagitis: Secondary | ICD-10-CM | POA: Diagnosis not present

## 2017-03-24 DIAGNOSIS — E559 Vitamin D deficiency, unspecified: Secondary | ICD-10-CM | POA: Diagnosis not present

## 2017-03-24 DIAGNOSIS — E782 Mixed hyperlipidemia: Secondary | ICD-10-CM | POA: Diagnosis not present

## 2017-03-24 DIAGNOSIS — G43909 Migraine, unspecified, not intractable, without status migrainosus: Secondary | ICD-10-CM | POA: Diagnosis not present

## 2017-03-24 DIAGNOSIS — Z23 Encounter for immunization: Secondary | ICD-10-CM | POA: Diagnosis not present

## 2017-03-24 DIAGNOSIS — Z0000 Encounter for general adult medical examination without abnormal findings: Secondary | ICD-10-CM | POA: Diagnosis not present

## 2017-04-26 DIAGNOSIS — M1711 Unilateral primary osteoarthritis, right knee: Secondary | ICD-10-CM | POA: Diagnosis not present

## 2017-04-26 DIAGNOSIS — M545 Low back pain: Secondary | ICD-10-CM | POA: Diagnosis not present

## 2017-04-26 DIAGNOSIS — M1712 Unilateral primary osteoarthritis, left knee: Secondary | ICD-10-CM | POA: Diagnosis not present

## 2017-04-26 DIAGNOSIS — M5441 Lumbago with sciatica, right side: Secondary | ICD-10-CM | POA: Diagnosis not present

## 2017-06-07 DIAGNOSIS — M25561 Pain in right knee: Secondary | ICD-10-CM | POA: Diagnosis not present

## 2017-06-07 DIAGNOSIS — M545 Low back pain: Secondary | ICD-10-CM | POA: Diagnosis not present

## 2017-06-07 DIAGNOSIS — M25562 Pain in left knee: Secondary | ICD-10-CM | POA: Diagnosis not present

## 2017-06-09 ENCOUNTER — Other Ambulatory Visit: Payer: Self-pay | Admitting: Orthopedic Surgery

## 2017-06-09 DIAGNOSIS — M545 Low back pain, unspecified: Secondary | ICD-10-CM

## 2017-06-15 ENCOUNTER — Ambulatory Visit
Admission: RE | Admit: 2017-06-15 | Discharge: 2017-06-15 | Disposition: A | Discharge status: Home / Self Care | Payer: Commercial Managed Care - PPO | Source: Ambulatory Visit | Attending: Orthopedic Surgery | Admitting: Orthopedic Surgery

## 2017-06-15 DIAGNOSIS — M48061 Spinal stenosis, lumbar region without neurogenic claudication: Secondary | ICD-10-CM | POA: Diagnosis not present

## 2017-06-15 DIAGNOSIS — M545 Low back pain, unspecified: Secondary | ICD-10-CM

## 2017-06-23 DIAGNOSIS — I10 Essential (primary) hypertension: Secondary | ICD-10-CM | POA: Diagnosis not present

## 2017-06-23 DIAGNOSIS — M5416 Radiculopathy, lumbar region: Secondary | ICD-10-CM | POA: Diagnosis not present

## 2017-06-28 DIAGNOSIS — M5441 Lumbago with sciatica, right side: Secondary | ICD-10-CM | POA: Diagnosis not present

## 2017-06-28 DIAGNOSIS — M5442 Lumbago with sciatica, left side: Secondary | ICD-10-CM | POA: Diagnosis not present

## 2017-06-28 DIAGNOSIS — M1712 Unilateral primary osteoarthritis, left knee: Secondary | ICD-10-CM | POA: Diagnosis not present

## 2017-06-28 DIAGNOSIS — M545 Low back pain: Secondary | ICD-10-CM | POA: Diagnosis not present

## 2017-06-28 DIAGNOSIS — M1711 Unilateral primary osteoarthritis, right knee: Secondary | ICD-10-CM | POA: Diagnosis not present

## 2017-06-29 DIAGNOSIS — M5126 Other intervertebral disc displacement, lumbar region: Secondary | ICD-10-CM | POA: Diagnosis not present

## 2017-07-01 ENCOUNTER — Other Ambulatory Visit: Payer: Self-pay | Admitting: Physical Medicine and Rehabilitation

## 2017-07-01 DIAGNOSIS — M545 Low back pain: Secondary | ICD-10-CM

## 2017-07-07 ENCOUNTER — Ambulatory Visit
Admission: RE | Admit: 2017-07-07 | Discharge: 2017-07-07 | Disposition: A | Discharge status: Home / Self Care | Payer: Commercial Managed Care - PPO | Source: Ambulatory Visit | Attending: Physical Medicine and Rehabilitation | Admitting: Physical Medicine and Rehabilitation

## 2017-07-07 DIAGNOSIS — M545 Low back pain: Secondary | ICD-10-CM

## 2017-07-07 DIAGNOSIS — M5416 Radiculopathy, lumbar region: Secondary | ICD-10-CM | POA: Diagnosis not present

## 2017-07-07 DIAGNOSIS — I10 Essential (primary) hypertension: Secondary | ICD-10-CM | POA: Diagnosis not present

## 2017-07-07 DIAGNOSIS — M5137 Other intervertebral disc degeneration, lumbosacral region: Secondary | ICD-10-CM | POA: Diagnosis not present

## 2017-07-07 MED ORDER — METHYLPREDNISOLONE ACETATE 40 MG/ML INJ SUSP (RADIOLOG
120.0000 mg | Freq: Once | INTRAMUSCULAR | Status: AC
  Administered 2017-07-07: 120 mg via EPIDURAL

## 2017-07-07 MED ORDER — IOPAMIDOL (ISOVUE-M 200) INJECTION 41%
1.0000 mL | Freq: Once | INTRAMUSCULAR | Status: AC
  Administered 2017-07-07: 1 mL via EPIDURAL

## 2017-07-07 NOTE — Discharge Instructions (Signed)

## 2017-09-22 DIAGNOSIS — I10 Essential (primary) hypertension: Secondary | ICD-10-CM | POA: Diagnosis not present

## 2017-09-22 DIAGNOSIS — E782 Mixed hyperlipidemia: Secondary | ICD-10-CM | POA: Diagnosis not present

## 2017-09-22 DIAGNOSIS — K219 Gastro-esophageal reflux disease without esophagitis: Secondary | ICD-10-CM | POA: Diagnosis not present

## 2018-05-16 ENCOUNTER — Other Ambulatory Visit (HOSPITAL_COMMUNITY)
Admission: RE | Admit: 2018-05-16 | Discharge: 2018-05-16 | Disposition: A | Discharge status: Home / Self Care | Payer: Commercial Managed Care - PPO | Source: Ambulatory Visit | Attending: Obstetrics and Gynecology | Admitting: Obstetrics and Gynecology

## 2018-05-16 ENCOUNTER — Other Ambulatory Visit: Payer: Self-pay | Admitting: Obstetrics and Gynecology

## 2018-05-16 ENCOUNTER — Ambulatory Visit
Admission: RE | Admit: 2018-05-16 | Discharge: 2018-05-16 | Disposition: A | Discharge status: Home / Self Care | Payer: Commercial Managed Care - PPO | Source: Ambulatory Visit | Attending: Obstetrics and Gynecology | Admitting: Obstetrics and Gynecology

## 2018-05-16 ENCOUNTER — Other Ambulatory Visit: Payer: Self-pay | Admitting: Physician Assistant

## 2018-05-16 DIAGNOSIS — Z01419 Encounter for gynecological examination (general) (routine) without abnormal findings: Secondary | ICD-10-CM | POA: Insufficient documentation

## 2018-05-16 DIAGNOSIS — Z1231 Encounter for screening mammogram for malignant neoplasm of breast: Secondary | ICD-10-CM

## 2018-05-18 ENCOUNTER — Other Ambulatory Visit: Payer: Self-pay | Admitting: Obstetrics and Gynecology

## 2018-05-18 DIAGNOSIS — R928 Other abnormal and inconclusive findings on diagnostic imaging of breast: Secondary | ICD-10-CM

## 2018-05-19 LAB — CYTOLOGY - PAP
DIAGNOSIS: NEGATIVE
Diagnosis: NEGATIVE
HPV: NOT DETECTED

## 2018-05-25 ENCOUNTER — Other Ambulatory Visit: Payer: Self-pay | Admitting: Obstetrics and Gynecology

## 2018-05-25 ENCOUNTER — Ambulatory Visit
Admission: RE | Admit: 2018-05-25 | Discharge: 2018-05-25 | Disposition: A | Discharge status: Home / Self Care | Payer: Commercial Managed Care - PPO | Source: Ambulatory Visit | Attending: Obstetrics and Gynecology | Admitting: Obstetrics and Gynecology

## 2018-05-25 DIAGNOSIS — N63 Unspecified lump in unspecified breast: Secondary | ICD-10-CM

## 2018-05-25 DIAGNOSIS — R928 Other abnormal and inconclusive findings on diagnostic imaging of breast: Secondary | ICD-10-CM

## 2018-06-15 ENCOUNTER — Ambulatory Visit: Payer: Self-pay

## 2018-11-24 ENCOUNTER — Ambulatory Visit: Payer: Commercial Managed Care - PPO

## 2018-11-24 ENCOUNTER — Other Ambulatory Visit: Payer: Self-pay

## 2018-11-24 ENCOUNTER — Ambulatory Visit
Admission: RE | Admit: 2018-11-24 | Discharge: 2018-11-24 | Disposition: A | Discharge status: Home / Self Care | Payer: Commercial Managed Care - PPO | Source: Ambulatory Visit | Attending: Obstetrics and Gynecology | Admitting: Obstetrics and Gynecology

## 2018-11-24 DIAGNOSIS — N63 Unspecified lump in unspecified breast: Secondary | ICD-10-CM

## 2020-02-24 IMAGING — MG DIGITAL SCREENING BILATERAL MAMMOGRAM WITH TOMO AND CAD
8 of 17 series · 8 of 40 positions shown · non-contrast
Comparison: None.

CLINICAL DATA: Screening.

EXAM:
DIGITAL SCREENING BILATERAL MAMMOGRAM WITH TOMO AND CAD

[L MLO synth-2D (1 of 3)]
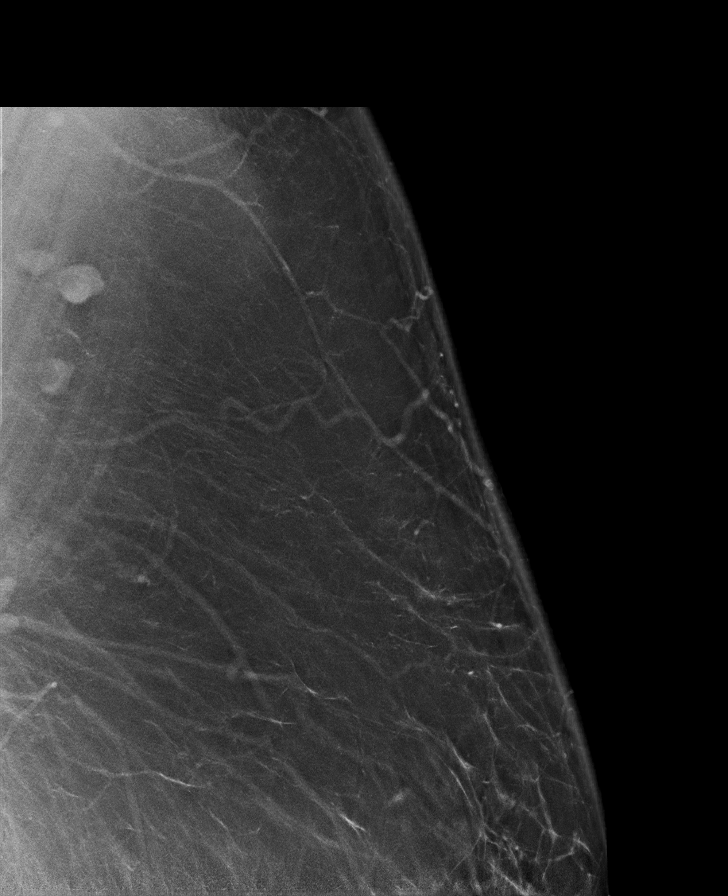

[L CC synth-2D]
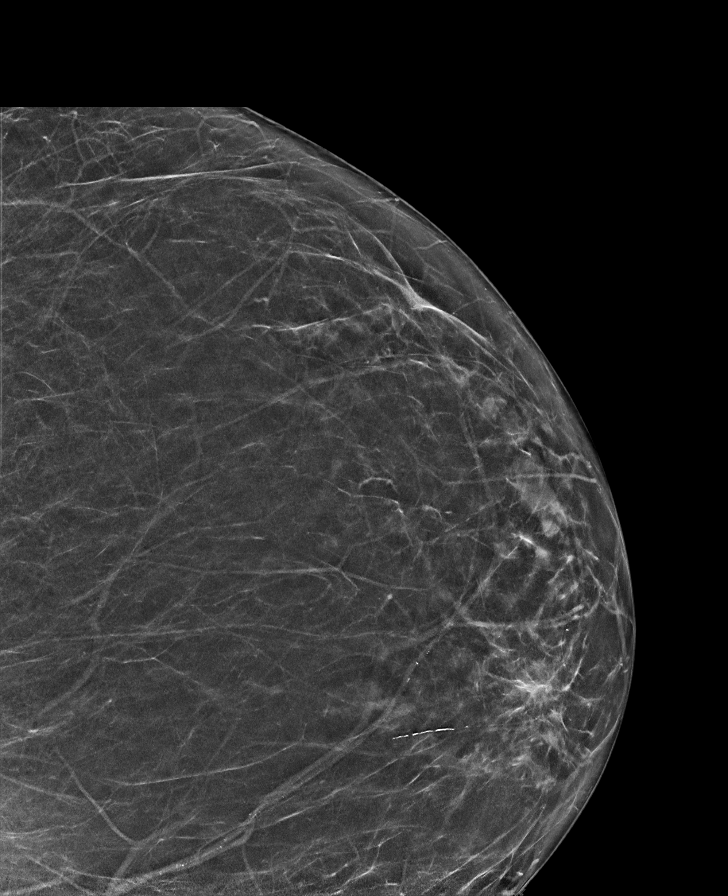

[R MLO synth-2D]
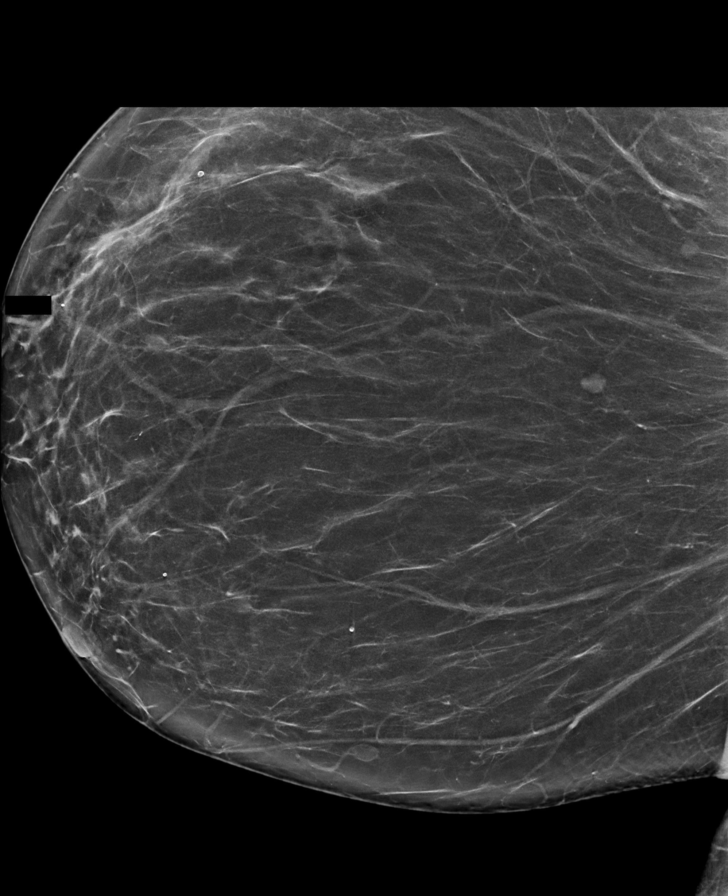

[L MLO synth-2D (2 of 3)]
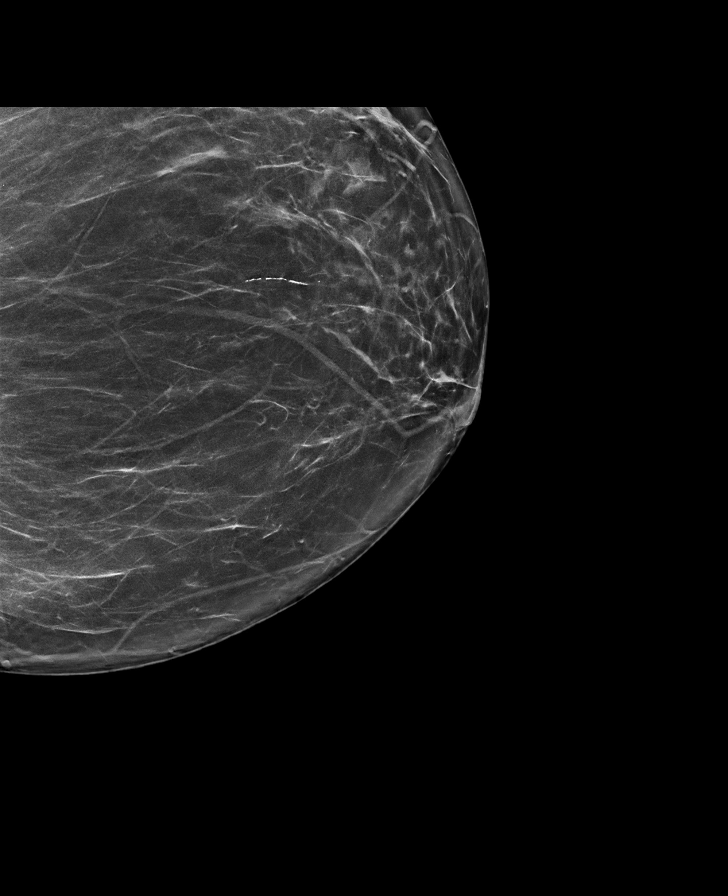

[L MLO synth-2D (3 of 3)]
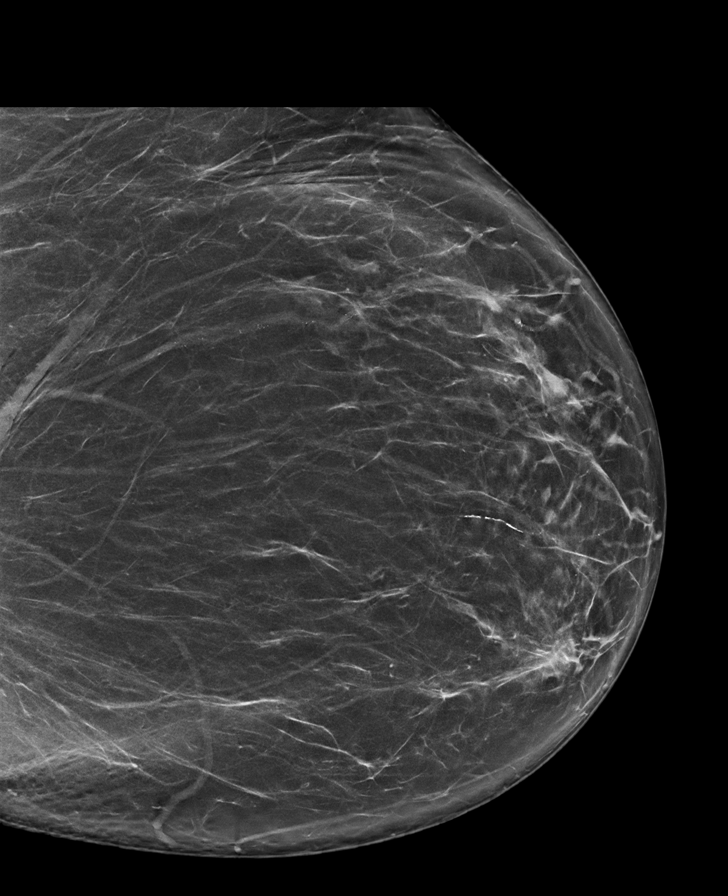

[R CC synth-2D (1 of 2)]
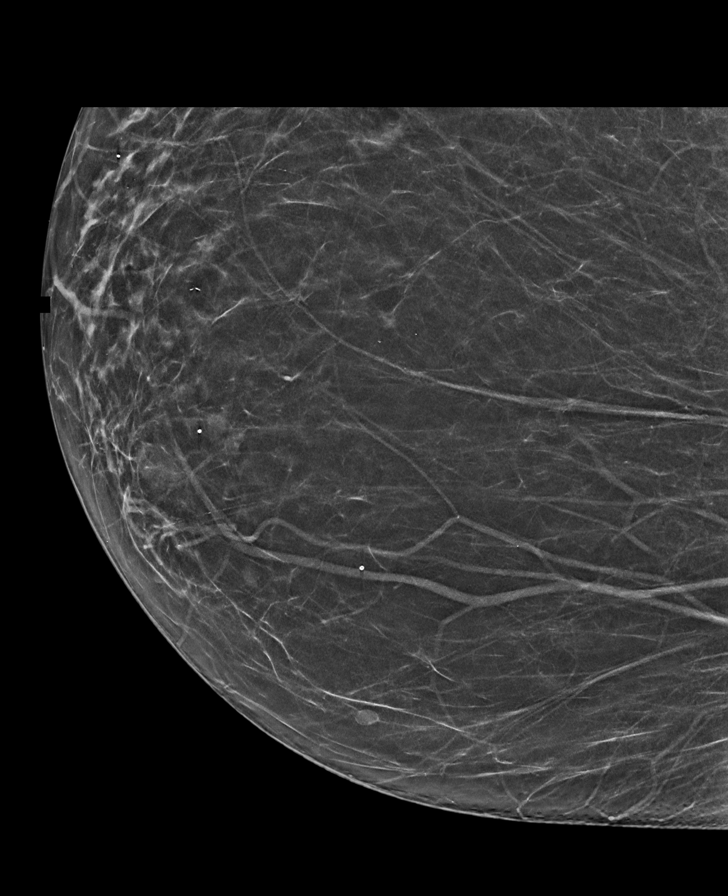

[R CC synth-2D (2 of 2)]
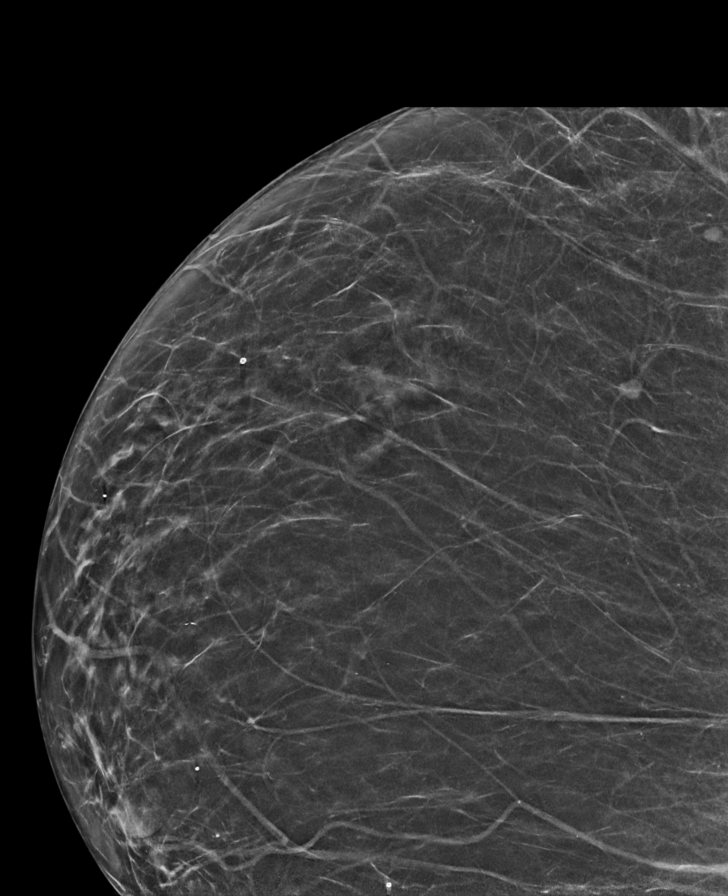

[R MLO tomo · tomo slice 47/92.0]
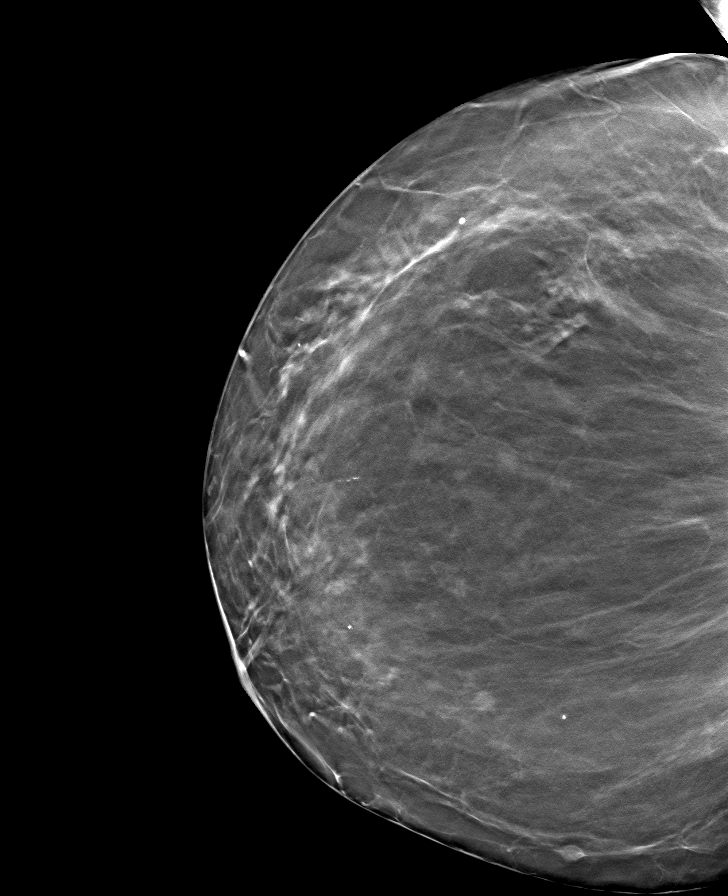

[8 of 40 positions shown; findings below may reference images not displayed]

ACR Breast Density Category b: There are scattered areas of
fibroglandular density.
FINDINGS: In the right breast, a possible mass warrants further evaluation. In
the left breast, no findings suspicious for malignancy. Images were
processed with CAD.
IMPRESSION: Further evaluation is suggested for possible mass in the right
breast.

RECOMMENDATION:
Diagnostic mammogram and possibly ultrasound of the right breast.
(Code:[id number])

The patient will be contacted regarding the findings, and additional
imaging will be scheduled.

BI-RADS CATEGORY  0: Incomplete. Need additional imaging evaluation
and/or prior mammograms for comparison.

## 2024-03-07 ENCOUNTER — Other Ambulatory Visit: Payer: Self-pay | Admitting: Family Medicine

## 2024-03-07 DIAGNOSIS — Z1231 Encounter for screening mammogram for malignant neoplasm of breast: Secondary | ICD-10-CM

## 2024-04-11 ENCOUNTER — Ambulatory Visit

## 2024-04-17 ENCOUNTER — Ambulatory Visit

## 2024-05-05 ENCOUNTER — Ambulatory Visit: Admission: RE | Admit: 2024-05-05 | Source: Ambulatory Visit

## 2024-05-05 DIAGNOSIS — Z1231 Encounter for screening mammogram for malignant neoplasm of breast: Secondary | ICD-10-CM
